# Patient Record
Sex: Male | Born: 1945 | Race: Black or African American | Hispanic: No | Marital: Married | Smoking: Never smoker
Health system: Southern US, Community
[De-identification: ages and names within clinical notes are randomized; demographics above are authoritative.]

## PROBLEM LIST (undated history)

## (undated) DIAGNOSIS — E119 Type 2 diabetes mellitus without complications: Secondary | ICD-10-CM

## (undated) DIAGNOSIS — G8929 Other chronic pain: Secondary | ICD-10-CM

## (undated) DIAGNOSIS — C801 Malignant (primary) neoplasm, unspecified: Secondary | ICD-10-CM

## (undated) DIAGNOSIS — G4730 Sleep apnea, unspecified: Secondary | ICD-10-CM

## (undated) DIAGNOSIS — M1990 Unspecified osteoarthritis, unspecified site: Secondary | ICD-10-CM

## (undated) DIAGNOSIS — F419 Anxiety disorder, unspecified: Secondary | ICD-10-CM

## (undated) DIAGNOSIS — N189 Chronic kidney disease, unspecified: Secondary | ICD-10-CM

## (undated) DIAGNOSIS — I499 Cardiac arrhythmia, unspecified: Secondary | ICD-10-CM

## (undated) DIAGNOSIS — I10 Essential (primary) hypertension: Secondary | ICD-10-CM

## (undated) DIAGNOSIS — F4310 Post-traumatic stress disorder, unspecified: Secondary | ICD-10-CM

## (undated) DIAGNOSIS — F329 Major depressive disorder, single episode, unspecified: Secondary | ICD-10-CM

## (undated) DIAGNOSIS — Z87442 Personal history of urinary calculi: Secondary | ICD-10-CM

## (undated) DIAGNOSIS — F32A Depression, unspecified: Secondary | ICD-10-CM

## (undated) DIAGNOSIS — Q828 Other specified congenital malformations of skin: Secondary | ICD-10-CM

## (undated) HISTORY — PX: OTHER SURGICAL HISTORY: SHX169

## (undated) HISTORY — PX: ORTHOPEDIC SURGERY: SHX850

## (undated) HISTORY — PX: COLONOSCOPY: SHX174

---
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
## 1999-08-15 ENCOUNTER — Encounter (HOSPITAL_BASED_OUTPATIENT_CLINIC_OR_DEPARTMENT_OTHER): Payer: Self-pay | Admitting: General Surgery

## 1999-08-16 ENCOUNTER — Ambulatory Visit (HOSPITAL_COMMUNITY): Admission: RE | Admit: 1999-08-16 | Discharge: 1999-08-17 | Payer: Self-pay | Admitting: General Surgery

## 2001-08-18 ENCOUNTER — Ambulatory Visit (HOSPITAL_BASED_OUTPATIENT_CLINIC_OR_DEPARTMENT_OTHER): Admission: RE | Admit: 2001-08-18 | Discharge: 2001-08-18 | Payer: Self-pay | Admitting: Family Medicine

## 2004-04-23 ENCOUNTER — Encounter: Admission: RE | Admit: 2004-04-23 | Discharge: 2004-04-23 | Payer: Self-pay | Admitting: Family Medicine

## 2004-05-13 ENCOUNTER — Encounter: Admission: RE | Admit: 2004-05-13 | Discharge: 2004-05-13 | Payer: Self-pay | Admitting: Family Medicine

## 2004-11-17 HISTORY — PX: TOTAL HIP ARTHROPLASTY: SHX124

## 2005-01-06 ENCOUNTER — Encounter: Admission: RE | Admit: 2005-01-06 | Discharge: 2005-01-06 | Payer: Self-pay | Admitting: Orthopedic Surgery

## 2005-06-25 ENCOUNTER — Encounter: Admission: RE | Admit: 2005-06-25 | Discharge: 2005-06-25 | Payer: Self-pay | Admitting: Sports Medicine

## 2005-09-03 ENCOUNTER — Inpatient Hospital Stay (HOSPITAL_COMMUNITY): Admission: RE | Admit: 2005-09-03 | Discharge: 2005-09-07 | Payer: Self-pay | Admitting: Orthopedic Surgery

## 2008-02-27 ENCOUNTER — Emergency Department: Payer: Self-pay | Admitting: Internal Medicine

## 2010-04-12 ENCOUNTER — Emergency Department (HOSPITAL_COMMUNITY): Admission: EM | Admit: 2010-04-12 | Discharge: 2010-04-12 | Payer: Self-pay | Admitting: Emergency Medicine

## 2010-04-14 ENCOUNTER — Emergency Department (HOSPITAL_COMMUNITY): Admission: EM | Admit: 2010-04-14 | Discharge: 2010-04-14 | Payer: Self-pay | Admitting: Emergency Medicine

## 2011-02-03 LAB — PROTIME-INR
INR: 1.05 (ref 0.00–1.49)
Prothrombin Time: 13.6 seconds (ref 11.6–15.2)

## 2011-02-03 LAB — DIFFERENTIAL
Basophils Absolute: 0 10*3/uL (ref 0.0–0.1)
Basophils Relative: 0 % (ref 0–1)
Eosinophils Absolute: 0.1 10*3/uL (ref 0.0–0.7)
Eosinophils Relative: 1 % (ref 0–5)
Lymphocytes Relative: 19 % (ref 12–46)
Lymphs Abs: 1.1 10*3/uL (ref 0.7–4.0)
Monocytes Absolute: 0.5 10*3/uL (ref 0.1–1.0)
Monocytes Relative: 9 % (ref 3–12)
Neutro Abs: 3.9 10*3/uL (ref 1.7–7.7)
Neutrophils Relative %: 71 % (ref 43–77)

## 2011-02-03 LAB — CBC
HCT: 41 % (ref 39.0–52.0)
Hemoglobin: 13.8 g/dL (ref 13.0–17.0)
MCHC: 33.7 g/dL (ref 30.0–36.0)
MCV: 85.8 fL (ref 78.0–100.0)
Platelets: 208 10*3/uL (ref 150–400)
RBC: 4.78 MIL/uL (ref 4.22–5.81)
RDW: 16.2 % — ABNORMAL HIGH (ref 11.5–15.5)
WBC: 5.6 10*3/uL (ref 4.0–10.5)

## 2011-02-03 LAB — POCT I-STAT, CHEM 8
BUN: 16 mg/dL (ref 6–23)
Calcium, Ion: 1.14 mmol/L (ref 1.12–1.32)
Chloride: 107 mEq/L (ref 96–112)
Creatinine, Ser: 1.1 mg/dL (ref 0.4–1.5)
Glucose, Bld: 111 mg/dL — ABNORMAL HIGH (ref 70–99)
HCT: 44 % (ref 39.0–52.0)
Hemoglobin: 15 g/dL (ref 13.0–17.0)
Potassium: 3.7 mEq/L (ref 3.5–5.1)
Sodium: 141 mEq/L (ref 135–145)
TCO2: 26 mmol/L (ref 0–100)

## 2011-04-04 NOTE — Op Note (Signed)
NAME:  Javier Watson, Javier Watson NO.:  [id number]   MEDICAL RECORD NO.:  [id number]          PATIENT TYPE:  INP   LOCATION:  2899                         FACILITY:  [facility]   PHYSICIAN:  Elana Alm. Thurston Hole, M.D. DATE OF BIRTH:  12/01/1945   DATE OF PROCEDURE:  09/03/2005  DATE OF DISCHARGE:                                 OPERATIVE REPORT   PREOPERATIVE DIAGNOSIS:  Left hip degenerative joint disease.   POSTOPERATIVE DIAGNOSIS:  Left hip degenerative joint disease.   PROCEDURE:  Left total hip replacement using S-ROM press-fit total hip  prosthesis with femoral component 18 x 30 mm stem with 36 +12 neck with 0/36-  mm femoral head with 18 F XXL sleeve, acetabulum 54-mm press-fit acetabulum  with 1 locking screw and metal liner.   SURGEON:  Elana Alm. Thurston Hole, M.D.   ASSISTANT:  Julien Girt, P.A.   ANESTHESIA:  General.   OPERATIVE TIME:  2 hours   ESTIMATED BLOOD LOSS:  500 mL.   COMPLICATIONS:  None.   DESCRIPTION OF PROCEDURE:  Javier Watson was brought to the operating room on  09/03/2005 and placed on the operating table in the supine position.  After  an adequate level of general anesthesia was obtained his left hip was  examined.  He had flexion to 90, extension to 0, internal and external  rotation of 20 degrees with approximately 0.5 cm shortening on the left leg  compared to the right.  He had a Foley catheter placed under sterile  conditions and received Ancef 1 gm IV preoperatively for prophylaxis.  He  was then turned into the left lateral decubitus position and secured on the  bed with a Mark frame.  The left hip and leg was then prepped using sterile  DuraPrep and draped using sterile technique.   Originally through a 15 cm posterolateral greater trochanteric incision  initial exposure was made.  The underlying subcutaneous tissues were incised  along with sin incision. The iliotibial band and gluteus maximus fascia was  incised longitudinally  revealing the underlying sciatic nerve which was  carefully protected.  The short external rotators of the hip and hip capsule  were carefully released off of their femoral neck insertion then tagged, and  then the hip was posteriorly dislocated.  The femoral head was found to have  grade 3 and 4  DJD with significant deformity of the femoral head.  The  femoral neck cut was then made 2.5 cm above the lesser trochanter.  At this  point, then the acetabulum was carefully exposed; carefully placed  retractors were placed.  Degenerative acetabular labrum was removed.  He was  found to have grade 3 and 4 changes throughout the acetabulum. Sequential  acetabular reaming was then carried out and the appropriate amount of  anteversion, abduction and inclination reaming up to a 53 mm size and then a  54 mm cup trial was placed and this was found to give an excellent fit.  It  was then removed and the actual 54-mm press-fit acetabular shell was placed  and hammered into position with the  appropriate amount of  anteversion,  abduction and inclination. It was very tight with an excellent fit, but it  was also further secured in place with one 20-mm, locking screw in the 12  o'clock position.   The metal liner was then placed inside the shell and hammered into position  with an excellent Morse taper fit. After this was done, then the proximal  femur was then exposed.  Sequential distal reaming was carried out to a 13.5-  mm size and then conical reaming carried out as well to a size F and then  the milling reamer was used to mill to an XXL size.  A trial sleeve XXL was  then placed followed by a trial femoral stem and with a trial femoral neck  of 36 +12 with a 0 head, 36 size, this was then placed on the trial  prosthesis and the hip reduced, found to give excellent reduction, excellent  maintainment of his leg lengths and equalized the leg lengths.  Excellent  stability up to 70-degrees of internal  rotation in both neutral and 30-  degrees of adduction and also stable in abduction and external rotation.  Because of this, it was felt that the trial femoral component was the  perfect size.  The trial was then removed, the femoral canal irrigated, and  then the actual XXL sleeve was hammered into position with an excellent and  then the 18 x 30 mm with the 36 +12 neck was hammered into position as well  with approximately 10-degrees of further anteversion, and then a 0 x 36 mm  femoral head was hammered onto the femoral neck with an excellent fit and  then the hip reduced taking it through full range of motion, found to be  stable, and leg lengths equal.   At this point, it was felt that all of the components were of excellent  size, fit, and stability.  The wound was further irrigated and then the  short external rotator of the hip and the hip capsule were reattached to  their femoral neck insertion through two drill holes on the greater  trochanter.  The iliotibial band and gluteus maximus fascia was closed with  #1 Ethibond sutures.  Subcutaneous tissues were closed with #0 and 2-0  Vicryl.  Skin closed with skin staples.  Sterile dressings were applied.  A  cape abduction pillow applied.  The patient turned supine, checked for leg  lengths that were equal, rotation equal.  Pulses 2+ and symmetric.  He was  then awakened, extubated, and taken to the recovery room in stable  condition.  The needle and sponge counts were correct x2 at the end of the  case.      Robert A. Thurston Hole, M.D.  Electronically Signed     RAW/MEDQ  D:  09/03/2005  T:  09/03/2005  Job:  [id number]

## 2011-04-04 NOTE — Discharge Summary (Signed)
NAME:  Javier Watson, Javier Watson NO.:  [id number]   MEDICAL RECORD NO.:  [id number]          PATIENT TYPE:  INP   LOCATION:  5006                         FACILITY:  [facility]   PHYSICIAN:  Elana Alm. Thurston Hole, M.D. DATE OF BIRTH:  06-07-1946   DATE OF ADMISSION:  09/03/2005  DATE OF DISCHARGE:  09/07/2005                                 DISCHARGE SUMMARY   ADMISSION DIAGNOSES:  1.  End-stage degenerative joint disease left hip.  2.  Hypertension.  3.  Sleep apnea.   DISCHARGE DIAGNOSES:  1.  End-stage degenerative joint disease left hip.  2.  Hypertension.  3.  Sleep apnea.  4.  Hypokalemia.   HISTORY OF PRESENT ILLNESS:  Patient is a 65 year old black male with a  history of hypertension and end-stage DJD of his hip.  He has failed  conservative care including anti-inflammatories and intra-articular  cortisone injections.  He understands the risks, benefits and possible  complications of a left total hip replacement and is without question.   PROCEDURE IN HOUSE:  On September 03, 2005, patient underwent a left total hip  replacement by Dr. Thurston Hole.  He was admitted postoperatively for pain  control, DVT prophylaxis and physical therapy.  Respiratory was consulted to  place him in a CPAP.   Postoperative day #1, patient was doing well and he slept well through the  night in his CPAP.  His hemoglobin was 11.9, INR 1.1.  His renal function  was slightly decreased with a BUN of 11 and creatinine of 1.5.  He was given  a normal saline bolus due to his dehydration.  His PCA was discontinued and  he was started on Percocet for pain.   Postoperative day #2, patient doing well except fever of 103.5, INR was 1.5.  Hemoglobin was 11.0.  Renal function was improved with BUN of 10 and  creatinine of 1.3.  Potassium supplementation was ordered for his potassium  of 3.3.  Blood cultures, chest x-ray and UA were ordered if he spiked a  temperature again.   Postoperative day #3, patient  has been afebrile.  Blood pressure 140/75.  Temperature 98.7.  His hemoglobin is 10.6.  INR is 1.6.  His potassium is 3.  His BUN is 13, creatinine is 1.4.  Chest x-ray showed no acute bony  abnormalities.  Blood culture showed no growth.   Postoperative day #4, patient continued to be alert, awake and comfortable.  Still had no growth.   He was discharged home on Coumadin, Percocet, potassium, Norvasc 5 mg daily  and Benicar 20 mg daily.   He achieves a CPM 0 to 90 degrees.  He has been instructed to be touchdown  weightbearing and have daily dressing changes, call with increased pain,  increased redness, increased drainage or a temperature greater than 101.  He  will follow up in the office on September 16, 2005, at 11 a.m.      Julien Girt, P.A.      Robert A. Thurston Hole, M.D.  Electronically Signed    KS/MEDQ  D:  10/27/2005  T:  10/28/2005  Job:  [id number]

## 2011-07-19 ENCOUNTER — Emergency Department (HOSPITAL_COMMUNITY): Payer: No Typology Code available for payment source

## 2011-07-19 ENCOUNTER — Emergency Department (HOSPITAL_COMMUNITY)
Admission: EM | Admit: 2011-07-19 | Discharge: 2011-07-19 | Disposition: A | Discharge status: Home / Self Care | Payer: No Typology Code available for payment source | Attending: Emergency Medicine | Admitting: Emergency Medicine

## 2011-07-19 DIAGNOSIS — S61209A Unspecified open wound of unspecified finger without damage to nail, initial encounter: Secondary | ICD-10-CM | POA: Insufficient documentation

## 2011-07-19 DIAGNOSIS — W208XXA Other cause of strike by thrown, projected or falling object, initial encounter: Secondary | ICD-10-CM | POA: Insufficient documentation

## 2011-07-19 DIAGNOSIS — Y92009 Unspecified place in unspecified non-institutional (private) residence as the place of occurrence of the external cause: Secondary | ICD-10-CM | POA: Insufficient documentation

## 2011-07-19 DIAGNOSIS — I10 Essential (primary) hypertension: Secondary | ICD-10-CM | POA: Insufficient documentation

## 2011-07-19 DIAGNOSIS — M79609 Pain in unspecified limb: Secondary | ICD-10-CM | POA: Insufficient documentation

## 2013-10-12 ENCOUNTER — Emergency Department: Payer: Self-pay | Admitting: Emergency Medicine

## 2016-01-16 HISTORY — PX: CYSTOSCOPY WITH URETEROSCOPY AND STENT PLACEMENT: SHX6377

## 2016-04-17 HISTORY — PX: URETEROSCOPY: SHX842

## 2016-04-22 ENCOUNTER — Ambulatory Visit: Payer: 59 | Admitting: Family Medicine

## 2016-04-22 DIAGNOSIS — Z0289 Encounter for other administrative examinations: Secondary | ICD-10-CM

## 2016-05-14 DIAGNOSIS — E876 Hypokalemia: Secondary | ICD-10-CM | POA: Diagnosis present

## 2016-05-14 DIAGNOSIS — I471 Supraventricular tachycardia: Principal | ICD-10-CM | POA: Diagnosis present

## 2016-05-14 DIAGNOSIS — R7303 Prediabetes: Secondary | ICD-10-CM | POA: Diagnosis present

## 2016-05-14 DIAGNOSIS — R338 Other retention of urine: Secondary | ICD-10-CM | POA: Diagnosis present

## 2016-05-14 DIAGNOSIS — Z7952 Long term (current) use of systemic steroids: Secondary | ICD-10-CM

## 2016-05-14 DIAGNOSIS — K219 Gastro-esophageal reflux disease without esophagitis: Secondary | ICD-10-CM | POA: Diagnosis present

## 2016-05-14 DIAGNOSIS — G8929 Other chronic pain: Secondary | ICD-10-CM | POA: Diagnosis present

## 2016-05-14 DIAGNOSIS — Z8249 Family history of ischemic heart disease and other diseases of the circulatory system: Secondary | ICD-10-CM

## 2016-05-14 DIAGNOSIS — R079 Chest pain, unspecified: Secondary | ICD-10-CM | POA: Diagnosis not present

## 2016-05-14 DIAGNOSIS — G4730 Sleep apnea, unspecified: Secondary | ICD-10-CM | POA: Diagnosis present

## 2016-05-14 DIAGNOSIS — F329 Major depressive disorder, single episode, unspecified: Secondary | ICD-10-CM | POA: Diagnosis present

## 2016-05-14 DIAGNOSIS — Z79899 Other long term (current) drug therapy: Secondary | ICD-10-CM

## 2016-05-14 DIAGNOSIS — I10 Essential (primary) hypertension: Secondary | ICD-10-CM | POA: Diagnosis present

## 2016-05-14 DIAGNOSIS — G4733 Obstructive sleep apnea (adult) (pediatric): Secondary | ICD-10-CM | POA: Diagnosis present

## 2016-05-14 DIAGNOSIS — N179 Acute kidney failure, unspecified: Secondary | ICD-10-CM | POA: Diagnosis present

## 2016-05-14 DIAGNOSIS — N401 Enlarged prostate with lower urinary tract symptoms: Secondary | ICD-10-CM | POA: Diagnosis present

## 2016-05-14 NOTE — ED Notes (Addendum)
Pt to triage via w/c with no distress noted; reports sudden onset sensation of heart racing; denies pain or any accomp symptoms; st d/c from [place] approx 2wks ago for same and was dx with "irregular heart beat, was given 2 injections to stop it and prescribed oral medication"; pt taken immediately to room 18 and placed on card monitor; Dr Owens Shark notified and called to room

## 2016-05-15 ENCOUNTER — Inpatient Hospital Stay (HOSPITAL_COMMUNITY)
Admit: 2016-05-15 | Discharge: 2016-05-15 | Disposition: A | Discharge status: Home / Self Care | Payer: Medicare Other | Attending: Internal Medicine | Admitting: Internal Medicine

## 2016-05-15 ENCOUNTER — Inpatient Hospital Stay
Admission: EM | Admit: 2016-05-15 | Discharge: 2016-05-16 | DRG: 309 | Disposition: A | Discharge status: Home / Self Care | Payer: Medicare Other | Attending: Internal Medicine | Admitting: Internal Medicine

## 2016-05-15 ENCOUNTER — Encounter: Payer: Self-pay | Admitting: Internal Medicine

## 2016-05-15 DIAGNOSIS — G8929 Other chronic pain: Secondary | ICD-10-CM | POA: Diagnosis present

## 2016-05-15 DIAGNOSIS — G4733 Obstructive sleep apnea (adult) (pediatric): Secondary | ICD-10-CM | POA: Diagnosis present

## 2016-05-15 DIAGNOSIS — R7989 Other specified abnormal findings of blood chemistry: Secondary | ICD-10-CM

## 2016-05-15 DIAGNOSIS — I10 Essential (primary) hypertension: Secondary | ICD-10-CM | POA: Diagnosis present

## 2016-05-15 DIAGNOSIS — R079 Chest pain, unspecified: Secondary | ICD-10-CM | POA: Diagnosis present

## 2016-05-15 DIAGNOSIS — I471 Supraventricular tachycardia, unspecified: Secondary | ICD-10-CM

## 2016-05-15 DIAGNOSIS — R7303 Prediabetes: Secondary | ICD-10-CM

## 2016-05-15 DIAGNOSIS — I214 Non-ST elevation (NSTEMI) myocardial infarction: Secondary | ICD-10-CM | POA: Insufficient documentation

## 2016-05-15 DIAGNOSIS — E876 Hypokalemia: Secondary | ICD-10-CM

## 2016-05-15 DIAGNOSIS — Z8249 Family history of ischemic heart disease and other diseases of the circulatory system: Secondary | ICD-10-CM | POA: Diagnosis not present

## 2016-05-15 DIAGNOSIS — R338 Other retention of urine: Secondary | ICD-10-CM | POA: Diagnosis present

## 2016-05-15 DIAGNOSIS — F329 Major depressive disorder, single episode, unspecified: Secondary | ICD-10-CM | POA: Diagnosis present

## 2016-05-15 DIAGNOSIS — K219 Gastro-esophageal reflux disease without esophagitis: Secondary | ICD-10-CM | POA: Diagnosis present

## 2016-05-15 DIAGNOSIS — N179 Acute kidney failure, unspecified: Secondary | ICD-10-CM

## 2016-05-15 DIAGNOSIS — G4730 Sleep apnea, unspecified: Secondary | ICD-10-CM | POA: Diagnosis present

## 2016-05-15 DIAGNOSIS — N401 Enlarged prostate with lower urinary tract symptoms: Secondary | ICD-10-CM | POA: Diagnosis present

## 2016-05-15 DIAGNOSIS — Z79899 Other long term (current) drug therapy: Secondary | ICD-10-CM | POA: Diagnosis not present

## 2016-05-15 DIAGNOSIS — Z7952 Long term (current) use of systemic steroids: Secondary | ICD-10-CM | POA: Diagnosis not present

## 2016-05-15 DIAGNOSIS — N400 Enlarged prostate without lower urinary tract symptoms: Secondary | ICD-10-CM

## 2016-05-15 DIAGNOSIS — N189 Chronic kidney disease, unspecified: Secondary | ICD-10-CM

## 2016-05-15 DIAGNOSIS — R778 Other specified abnormalities of plasma proteins: Secondary | ICD-10-CM

## 2016-05-15 HISTORY — DX: Other chronic pain: G89.29

## 2016-05-15 HISTORY — DX: Essential (primary) hypertension: I10

## 2016-05-15 LAB — CBC WITH DIFFERENTIAL/PLATELET
Basophils Absolute: 0 10*3/uL (ref 0–0.1)
Basophils Relative: 1 %
Eosinophils Absolute: 0.1 10*3/uL (ref 0–0.7)
Eosinophils Relative: 2 %
HCT: 32.3 % — ABNORMAL LOW (ref 40.0–52.0)
Hemoglobin: 11.3 g/dL — ABNORMAL LOW (ref 13.0–18.0)
Lymphocytes Relative: 34 %
Lymphs Abs: 2.1 10*3/uL (ref 1.0–3.6)
MCH: 29.1 pg (ref 26.0–34.0)
MCHC: 35.1 g/dL (ref 32.0–36.0)
MCV: 82.9 fL (ref 80.0–100.0)
Monocytes Absolute: 0.7 10*3/uL (ref 0.2–1.0)
Monocytes Relative: 12 %
Neutro Abs: 3.1 10*3/uL (ref 1.4–6.5)
Neutrophils Relative %: 51 %
Platelets: 153 10*3/uL (ref 150–440)
RBC: 3.9 MIL/uL — ABNORMAL LOW (ref 4.40–5.90)
RDW: 16.9 % — ABNORMAL HIGH (ref 11.5–14.5)
WBC: 6 10*3/uL (ref 3.8–10.6)

## 2016-05-15 LAB — COMPREHENSIVE METABOLIC PANEL
ALT: 31 U/L (ref 17–63)
AST: 31 U/L (ref 15–41)
Albumin: 3.1 g/dL — ABNORMAL LOW (ref 3.5–5.0)
Alkaline Phosphatase: 102 U/L (ref 38–126)
Anion gap: 9 (ref 5–15)
BUN: 25 mg/dL — ABNORMAL HIGH (ref 6–20)
CO2: 24 mmol/L (ref 22–32)
Calcium: 8.7 mg/dL — ABNORMAL LOW (ref 8.9–10.3)
Chloride: 105 mmol/L (ref 101–111)
Creatinine, Ser: 1.89 mg/dL — ABNORMAL HIGH (ref 0.61–1.24)
GFR calc Af Amer: 40 mL/min — ABNORMAL LOW (ref >60)
GFR calc non Af Amer: 35 mL/min — ABNORMAL LOW (ref >60)
Glucose, Bld: 161 mg/dL — ABNORMAL HIGH (ref 65–99)
Potassium: 2.8 mmol/L — ABNORMAL LOW (ref 3.5–5.1)
Sodium: 138 mmol/L (ref 135–145)
Total Bilirubin: <0.1 mg/dL — ABNORMAL LOW (ref 0.3–1.2)
Total Protein: 6.4 g/dL — ABNORMAL LOW (ref 6.5–8.1)

## 2016-05-15 LAB — MAGNESIUM: Magnesium: 1.6 mg/dL — ABNORMAL LOW (ref 1.7–2.4)

## 2016-05-15 LAB — TROPONIN I
Troponin I: 0.27 ng/mL (ref <0.03)
Troponin I: 0.31 ng/mL (ref <0.03)
Troponin I: 0.24 ng/mL (ref <0.03)

## 2016-05-15 LAB — ECHOCARDIOGRAM COMPLETE
Height: 68 in
Weight: 3680 oz

## 2016-05-15 LAB — HEMOGLOBIN A1C: Hgb A1c MFr Bld: 6.2 % — ABNORMAL HIGH (ref 4.0–6.0)

## 2016-05-15 LAB — PHOSPHORUS: Phosphorus: 3.4 mg/dL (ref 2.5–4.6)

## 2016-05-15 LAB — POTASSIUM
Potassium: 3.2 mmol/L — ABNORMAL LOW (ref 3.5–5.1)
Potassium: 3.9 mmol/L (ref 3.5–5.1)

## 2016-05-15 LAB — TSH
TSH: 1.141 u[IU]/mL (ref 0.350–4.500)
TSH: 1.063 u[IU]/mL (ref 0.350–4.500)

## 2016-05-15 MED ORDER — ADENOSINE 6 MG/2ML IV SOLN
INTRAVENOUS | Status: AC
  Administered 2016-05-15: 6 mg
  Filled 2016-05-15: qty 2

## 2016-05-15 MED ORDER — LOSARTAN POTASSIUM 50 MG PO TABS
100.0000 mg | ORAL_TABLET | Freq: Every day | ORAL | Status: DC
  Administered 2016-05-16: 100 mg via ORAL
  Filled 2016-05-15 (×2): qty 2
  Filled 2016-05-15: qty 4

## 2016-05-15 MED ORDER — POTASSIUM CHLORIDE CRYS ER 20 MEQ PO TBCR
EXTENDED_RELEASE_TABLET | ORAL | Status: AC
  Administered 2016-05-15: 40 meq via ORAL
  Filled 2016-05-15: qty 2

## 2016-05-15 MED ORDER — POTASSIUM CHLORIDE IN NACL 40-0.9 MEQ/L-% IV SOLN
INTRAVENOUS | Status: DC
  Administered 2016-05-15 (×3): 125 mL/h via INTRAVENOUS
  Filled 2016-05-15 (×6): qty 1000

## 2016-05-15 MED ORDER — CYCLOBENZAPRINE HCL 10 MG PO TABS: 5.0000 mg | ORAL_TABLET | Freq: Three times a day (TID) | ORAL | Status: DC | PRN

## 2016-05-15 MED ORDER — ACETAMINOPHEN 325 MG PO TABS: 650.0000 mg | ORAL_TABLET | Freq: Four times a day (QID) | ORAL | Status: DC | PRN

## 2016-05-15 MED ORDER — HEPARIN SODIUM (PORCINE) 5000 UNIT/ML IJ SOLN
5000.0000 [IU] | Freq: Three times a day (TID) | INTRAMUSCULAR | Status: DC
  Administered 2016-05-15 – 2016-05-16 (×4): 5000 [IU] via SUBCUTANEOUS
  Filled 2016-05-15 (×4): qty 1

## 2016-05-15 MED ORDER — SODIUM CHLORIDE 0.9% FLUSH: 3.0000 mL | Freq: Two times a day (BID) | INTRAVENOUS | Status: DC

## 2016-05-15 MED ORDER — MORPHINE SULFATE (PF) 2 MG/ML IV SOLN: 2.0000 mg | INTRAVENOUS | Status: DC | PRN

## 2016-05-15 MED ORDER — DILTIAZEM HCL 25 MG/5ML IV SOLN
10.0000 mg | Freq: Once | INTRAVENOUS | Status: AC
  Administered 2016-05-15: 10 mg via INTRAVENOUS

## 2016-05-15 MED ORDER — ONDANSETRON HCL 4 MG/2ML IJ SOLN: 4.0000 mg | Freq: Four times a day (QID) | INTRAMUSCULAR | Status: DC | PRN

## 2016-05-15 MED ORDER — POTASSIUM CHLORIDE IN NACL 40-0.9 MEQ/L-% IV SOLN
INTRAVENOUS | Status: AC
  Filled 2016-05-15: qty 1000

## 2016-05-15 MED ORDER — DILTIAZEM HCL 60 MG PO TABS
60.0000 mg | ORAL_TABLET | Freq: Three times a day (TID) | ORAL | Status: DC
  Administered 2016-05-15: 60 mg via ORAL
  Filled 2016-05-15: qty 1

## 2016-05-15 MED ORDER — MAGNESIUM SULFATE 2 GM/50ML IV SOLN
2.0000 g | Freq: Once | INTRAVENOUS | Status: AC
  Administered 2016-05-15: 2 g via INTRAVENOUS
  Filled 2016-05-15: qty 50

## 2016-05-15 MED ORDER — ACETAMINOPHEN 650 MG RE SUPP: 650.0000 mg | Freq: Four times a day (QID) | RECTAL | Status: DC | PRN

## 2016-05-15 MED ORDER — POTASSIUM CHLORIDE CRYS ER 20 MEQ PO TBCR
40.0000 meq | EXTENDED_RELEASE_TABLET | ORAL | Status: AC
  Administered 2016-05-15 (×2): 40 meq via ORAL
  Filled 2016-05-15: qty 2

## 2016-05-15 MED ORDER — MORPHINE SULFATE 15 MG PO TABS: 15.0000 mg | ORAL_TABLET | ORAL | Status: DC | PRN

## 2016-05-15 MED ORDER — OXYBUTYNIN CHLORIDE 5 MG PO TABS
5.0000 mg | ORAL_TABLET | Freq: Three times a day (TID) | ORAL | Status: DC
  Administered 2016-05-15 – 2016-05-16 (×3): 5 mg via ORAL
  Filled 2016-05-15 (×8): qty 1

## 2016-05-15 MED ORDER — CITALOPRAM HYDROBROMIDE 20 MG PO TABS
40.0000 mg | ORAL_TABLET | Freq: Every day | ORAL | Status: DC
  Administered 2016-05-15 – 2016-05-16 (×2): 40 mg via ORAL
  Filled 2016-05-15 (×2): qty 2

## 2016-05-15 MED ORDER — PANTOPRAZOLE SODIUM 40 MG PO TBEC
40.0000 mg | DELAYED_RELEASE_TABLET | Freq: Two times a day (BID) | ORAL | Status: DC
  Administered 2016-05-15 – 2016-05-16 (×3): 40 mg via ORAL
  Filled 2016-05-15 (×3): qty 1

## 2016-05-15 MED ORDER — AMLODIPINE BESYLATE 5 MG PO TABS: 5.0000 mg | ORAL_TABLET | Freq: Every day | ORAL | Status: DC

## 2016-05-15 MED ORDER — DOCUSATE SODIUM 100 MG PO CAPS
100.0000 mg | ORAL_CAPSULE | Freq: Two times a day (BID) | ORAL | Status: DC
  Administered 2016-05-15 – 2016-05-16 (×3): 100 mg via ORAL
  Filled 2016-05-15 (×3): qty 1

## 2016-05-15 MED ORDER — METOPROLOL TARTRATE 25 MG PO TABS
25.0000 mg | ORAL_TABLET | Freq: Every day | ORAL | Status: DC
  Administered 2016-05-15: 25 mg via ORAL
  Filled 2016-05-15: qty 1

## 2016-05-15 MED ORDER — PRAZOSIN HCL 5 MG PO CAPS
5.0000 mg | ORAL_CAPSULE | Freq: Two times a day (BID) | ORAL | Status: DC
  Administered 2016-05-15 – 2016-05-16 (×3): 5 mg via ORAL
  Filled 2016-05-15 (×4): qty 1

## 2016-05-15 MED ORDER — DILTIAZEM HCL 100 MG IV SOLR
5.0000 mg/h | Freq: Once | INTRAVENOUS | Status: DC
  Filled 2016-05-15: qty 100

## 2016-05-15 MED ORDER — POTASSIUM CHLORIDE CRYS ER 20 MEQ PO TBCR
20.0000 meq | EXTENDED_RELEASE_TABLET | Freq: Every day | ORAL | Status: DC
  Administered 2016-05-15 – 2016-05-16 (×2): 20 meq via ORAL
  Filled 2016-05-15 (×2): qty 1

## 2016-05-15 MED ORDER — SODIUM CHLORIDE 0.9 % IV BOLUS (SEPSIS)
1000.0000 mL | Freq: Once | INTRAVENOUS | Status: AC
  Administered 2016-05-15: 1000 mL via INTRAVENOUS

## 2016-05-15 MED ORDER — DILTIAZEM HCL 25 MG/5ML IV SOLN
INTRAVENOUS | Status: AC
  Administered 2016-05-15: 10 mg via INTRAVENOUS
  Filled 2016-05-15: qty 5

## 2016-05-15 MED ORDER — HYDROCODONE-ACETAMINOPHEN 5-325 MG PO TABS: 1.0000 | ORAL_TABLET | Freq: Four times a day (QID) | ORAL | Status: DC | PRN

## 2016-05-15 MED ORDER — ADENOSINE 12 MG/4ML IV SOLN
INTRAVENOUS | Status: AC
  Administered 2016-05-15: 12 mg
  Filled 2016-05-15: qty 4

## 2016-05-15 MED ORDER — BUPROPION HCL ER (SR) 100 MG PO TB12
200.0000 mg | ORAL_TABLET | Freq: Two times a day (BID) | ORAL | Status: DC
  Administered 2016-05-15 – 2016-05-16 (×3): 200 mg via ORAL
  Filled 2016-05-15 (×4): qty 2

## 2016-05-15 MED ORDER — DILTIAZEM HCL ER COATED BEADS 120 MG PO CP24
120.0000 mg | ORAL_CAPSULE | Freq: Two times a day (BID) | ORAL | Status: DC
  Administered 2016-05-15 – 2016-05-16 (×2): 120 mg via ORAL
  Filled 2016-05-15 (×2): qty 1

## 2016-05-15 MED ORDER — ONDANSETRON HCL 4 MG PO TABS: 4.0000 mg | ORAL_TABLET | Freq: Four times a day (QID) | ORAL | Status: DC | PRN

## 2016-05-15 NOTE — Progress Notes (Signed)
MEDICATION RELATED CONSULT NOTE - INITIAL   Pharmacy Consult for Electrolyte Management Indication: Hypokalemia  No Known Allergies  Patient Measurements: Height: 5\' 8"  (172.7 cm) Weight: 230 lb (104.327 kg) IBW/kg (Calculated) : 68.4 Adjusted Body Weight: 82.7 kg  Vital Signs: Temp: 98.1 F (36.7 C) (06/29 1044) Temp Source: Oral (06/29 1044) BP: 121/59 mmHg (06/29 1301) Pulse Rate: 50 (06/29 1301) Intake/Output from previous day: 06/28 0701 - 06/29 0700 In: 141.7 [P.O.:100; I.V.:41.7] Out: -  Intake/Output from this shift:    Labs:  Recent Labs  05/15/16 0030 05/15/16 0851  WBC 6.0  --   HGB 11.3*  --   HCT 32.3*  --   PLT 153  --   CREATININE 1.89*  --   MG  --  1.6*  PHOS  --  3.4  ALBUMIN 3.1*  --   PROT 6.4*  --   AST 31  --   ALT 31  --   ALKPHOS 102  --   BILITOT <0.1*  --    Estimated Creatinine Clearance: 43.2 mL/min (by C-G formula based on Cr of 1.89).   Microbiology: No results found for this or any previous visit (from the past 720 hour(s)).  Medical History: Past Medical History  Diagnosis Date  . Hypertension   . 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 buPROPion  200 mg Oral BID  . citalopram  40 mg Oral Daily  . diltiazem  120 mg Oral BID  . docusate sodium  100 mg Oral BID  . heparin  5,000 Units Subcutaneous Q8H  . losartan  100 mg Oral Daily  . oxybutynin  5 mg Oral TID PC & HS  . pantoprazole  40 mg Oral BID  . potassium chloride SA  20 mEq Oral Daily  . prazosin  5 mg Oral BID  . sodium chloride flush  3 mL Intravenous Q12H   Infusions:  . 0.9 % NaCl with KCl 40 mEq / L 125 mL/hr (05/15/16 1351)    Assessment: Patient admitted for NSTEMI. Hypokalemic on admission with K of 2.8, received KCl 73mEq PO x 2 and initiated on IV with KCl. Repeat K this morning of 3.2, Mag=1.6, Phos=3.4. Currently ordered KCl 76mEq PO daily in addition to IV fluid supplementation. Has order for Mag Sulfate 2g IV  once.  Goal of Therapy:  Keep electrolyes WNL  Plan:  K=3.9. No supplementation needed. Follow up labs in the AM  Lynsay Fesperman D Nakayla Rorabaugh, Pharm.D Clinical Pharmacist   05/15/2016 7:04 PM

## 2016-05-15 NOTE — Progress Notes (Signed)
MEDICATION RELATED CONSULT NOTE - INITIAL   Pharmacy Consult for Electrolyte Supplementation  Indication: hypokalemia  No Known Allergies  Patient Measurements: Height: 5\' 8"  (172.7 cm) Weight: 230 lb (104.327 kg) IBW/kg (Calculated) : 68.4   Vital Signs: Temp: 98.1 F (36.7 C) (06/29 1044) Temp Source: Oral (06/29 1044) BP: 158/83 mmHg (06/29 1044) Pulse Rate: 60 (06/29 1044) Intake/Output from previous day: 06/28 0701 - 06/29 0700 In: 141.7 [P.O.:100; I.V.:41.7] Out: -  Intake/Output from this shift: Total I/O In: 601.8 [P.O.:240; I.V.:361.8] Out: 600 [Urine:600]  Labs:  Recent Labs  05/15/16 0030 05/15/16 0851  WBC 6.0  --   HGB 11.3*  --   HCT 32.3*  --   PLT 153  --   CREATININE 1.89*  --   MG  --  1.6*  PHOS  --  3.4  ALBUMIN 3.1*  --   PROT 6.4*  --   AST 31  --   ALT 31  --   ALKPHOS 102  --   BILITOT <0.1*  --    Estimated Creatinine Clearance: 43.2 mL/min (by C-G formula based on Cr of 1.89).   Microbiology: No results found for this or any previous visit (from the past 720 hour(s)).  Medical History: Past Medical History  Diagnosis Date  . Hypertension   . 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 buPROPion  200 mg Oral BID  . citalopram  40 mg Oral Daily  . diltiazem  60 mg Oral Q8H  . docusate sodium  100 mg Oral BID  . heparin  5,000 Units Subcutaneous Q8H  . losartan  100 mg Oral Daily  . magnesium sulfate 1 - 4 g bolus IVPB  2 g Intravenous Once  . metoprolol tartrate  25 mg Oral Daily  . oxybutynin  5 mg Oral TID PC & HS  . pantoprazole  40 mg Oral BID  . potassium chloride SA  20 mEq Oral Daily  . prazosin  5 mg Oral BID  . sodium chloride flush  3 mL Intravenous Q12H   Infusions:  . 0.9 % NaCl with KCl 40 mEq / L 125 mL/hr at 05/15/16 1100    Assessment: Pharmacy consulted to supplement electrolytes in a 70 yo male admitted for NSTEMI. Patient has received KCl 40 meq x 2 doses and fluids  with 40 meq of KCl at 125 mL/hr.  Currently has scheduled KCl 20 meq po daily.   K: 2.9, Mag; 1.6, Phos: 3.4 F/U K: 3.2  Goal of Therapy:  K: 3.5-5.1 Mag: 1.7-2.4 Phos: 2.5-4.6  Plan:  Follow up potassium at 0851 of 3.2.  Patient received KCl 20 meq po and has KCl in fluids to provide ~120 meq/24h period. Will recheck potassium level at 1800 to ensure no further supplementation warranted.  BMP/mag ordered with AM labs.   Pharmacy will continue to follow.   Md Smola G 05/15/2016,11:26 AM

## 2016-05-15 NOTE — Progress Notes (Signed)
*  PRELIMINARY RESULTS* Echocardiogram 2D Echocardiogram has been performed.  Javier Watson 05/15/2016, 10:46 AM

## 2016-05-15 NOTE — Progress Notes (Signed)
Javier Watson at [place] NAME: Javier Watson    MR#:  [id number]  DATE OF BIRTH:  Nov 12, 1946  SUBJECTIVE:   Patient Had episode of SVT this morning. He tried vagal maneuvers which helped. He was diagnosed over a month ago with SVT at the [place]. He was given metoprolol to take which she says he takes twice a day.  REVIEW OF SYSTEMS:    Review of Systems  Constitutional: Negative for fever, chills and malaise/fatigue.  HENT: Negative for ear discharge, ear pain, hearing loss, nosebleeds and sore throat.   Eyes: Negative for blurred vision and pain.  Respiratory: Negative for cough, hemoptysis, shortness of breath and wheezing.   Cardiovascular: Positive for palpitations. Negative for chest pain and leg swelling.  Gastrointestinal: Negative for nausea, vomiting, abdominal pain, diarrhea and blood in stool.  Genitourinary: Negative for dysuria.  Musculoskeletal: Negative for back pain.  Neurological: Negative for dizziness, tremors, speech change, focal weakness, seizures and headaches.  Endo/Heme/Allergies: Does not bruise/bleed easily.  Psychiatric/Behavioral: Negative for depression, suicidal ideas and hallucinations.    Tolerating Diet: yes     DRUG ALLERGIES:  No Known Allergies  VITALS:  Blood pressure 158/83, pulse 60, temperature 98.1 F (36.7 C), temperature source Oral, resp. rate 18, height 5\' 8"  (1.727 m), weight 104.327 kg (230 lb), SpO2 98 %.  PHYSICAL EXAMINATION:   Physical Exam  Constitutional: He is oriented to person, place, and time and well-developed, well-nourished, and in no distress. No distress.  HENT:  Head: Normocephalic.  Eyes: No scleral icterus.  Neck: Normal range of motion. Neck supple. No JVD present. No tracheal deviation present.  Cardiovascular: Normal rate, regular rhythm and normal heart sounds.  Exam reveals no gallop and no friction rub.   No murmur heard. Pulmonary/Chest: Effort normal and breath  sounds normal. No respiratory distress. He has no wheezes. He has no rales. He exhibits no tenderness.  Abdominal: Soft. Bowel sounds are normal. He exhibits no distension and no mass. There is no tenderness. There is no rebound and no guarding.  Musculoskeletal: Normal range of motion. He exhibits no edema.  Neurological: He is alert and oriented to person, place, and time.  Skin: Skin is warm. No rash noted. No erythema.  Psychiatric: Affect and judgment normal.      LABORATORY PANEL:   CBC  Recent Labs Lab 05/15/16 0030  WBC 6.0  HGB 11.3*  HCT 32.3*  PLT 153   ------------------------------------------------------------------------------------------------------------------  Chemistries   Recent Labs Lab 05/15/16 0030 05/15/16 0851  NA 138  --   K 2.8* 3.2*  CL 105  --   CO2 24  --   GLUCOSE 161*  --   BUN 25*  --   CREATININE 1.89*  --   CALCIUM 8.7*  --   MG  --  1.6*  AST 31  --   ALT 31  --   ALKPHOS 102  --   BILITOT <0.1*  --    ------------------------------------------------------------------------------------------------------------------  Cardiac Enzymes  Recent Labs Lab 05/15/16 0030 05/15/16 0339 05/15/16 0851  TROPONINI 0.24* 0.27* 0.31*   ------------------------------------------------------------------------------------------------------------------  RADIOLOGY:  No results found.   ASSESSMENT AND PLAN:   70 year old male who was admitted for SVT.  1. SVT in the setting of hypo-kalemia and hypo-magnesium: Order echocardiogram. Await cardiology consult. Electrolytes corrected. Continue metoprolol and diltiazem. Check TSH.   2. Elevated troponin: This is likely due to SVT and not ACS. Follow-up on card allergy consult.  3. Essential hypertension: Continue valsartan and the Toprol. Amlodipine was discontinued and diltiazem was added due to problem #1.   4. BPH: Continue Minipress and Ditropan.  5. GERD: Continue PPI.  6.  Electrolyte abnormalities including potassium and magnesium: Repleted and recheck in a.m.  Management plans discussed with the patient and he is in agreement.  CODE STATUS: full  TOTAL TIME TAKING CARE OF THIS PATIENT: 30 minutes.     POSSIBLE D/C tomorrow, DEPENDING ON CLINICAL CONDITION.   Stillman Buenger M.D on 05/15/2016 at 11:56 AM  Between 7am to 6pm - Pager - [phone] After 6pm go to [email or web address] - password [email or web address] [facility]  Office  [phone]  CC: Primary care physician; PROVIDER NOT IN SYSTEM  Note: This dictation was prepared with Dragon dictation along with smaller phrase technology. Any transcriptional errors that result from this process are unintentional.

## 2016-05-15 NOTE — Progress Notes (Signed)
MEDICATION RELATED CONSULT NOTE - INITIAL   Pharmacy Consult for Electrolyte Management Indication: Hypokalemia  No Known Allergies  Patient Measurements: Height: 5\' 8"  (172.7 cm) Weight: 230 lb (104.327 kg) IBW/kg (Calculated) : 68.4 Adjusted Body Weight: 82.7 kg  Vital Signs: Temp: 98.1 F (36.7 C) (06/29 1044) Temp Source: Oral (06/29 1044) BP: 158/83 mmHg (06/29 1044) Pulse Rate: 60 (06/29 1044) Intake/Output from previous day: 06/28 0701 - 06/29 0700 In: 141.7 [P.O.:100; I.V.:41.7] Out: -  Intake/Output from this shift: Total I/O In: 601.8 [P.O.:240; I.V.:361.8] Out: 600 [Urine:600]  Labs:  Recent Labs  05/15/16 0030 05/15/16 0851  WBC 6.0  --   HGB 11.3*  --   HCT 32.3*  --   PLT 153  --   CREATININE 1.89*  --   MG  --  1.6*  PHOS  --  3.4  ALBUMIN 3.1*  --   PROT 6.4*  --   AST 31  --   ALT 31  --   ALKPHOS 102  --   BILITOT <0.1*  --    Estimated Creatinine Clearance: 43.2 mL/min (by C-G formula based on Cr of 1.89).   Microbiology: No results found for this or any previous visit (from the past 720 hour(s)).  Medical History: Past Medical History  Diagnosis Date  . Hypertension   . 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 buPROPion  200 mg Oral BID  . citalopram  40 mg Oral Daily  . diltiazem  60 mg Oral Q8H  . docusate sodium  100 mg Oral BID  . heparin  5,000 Units Subcutaneous Q8H  . losartan  100 mg Oral Daily  . magnesium sulfate 1 - 4 g bolus IVPB  2 g Intravenous Once  . metoprolol tartrate  25 mg Oral Daily  . oxybutynin  5 mg Oral TID PC & HS  . pantoprazole  40 mg Oral BID  . potassium chloride SA  20 mEq Oral Daily  . prazosin  5 mg Oral BID  . sodium chloride flush  3 mL Intravenous Q12H   Infusions:  . 0.9 % NaCl with KCl 40 mEq / L 125 mL/hr at 05/15/16 1100    Assessment: Patient admitted for NSTEMI. Hypokalemic on admission with K of 2.8, received KCl 66mEq PO x 2 and initiated on IV  with KCl. Repeat K this morning of 3.2, Mag=1.6, Phos=3.4. Currently ordered KCl 7mEq PO daily in addition to IV fluid supplementation. Has order for Mag Sulfate 2g IV once.  Goal of Therapy:  Keep electrolyes WNL  Plan:  Will follow up on AM labs.  Paulina Fusi, PharmD, BCPS 05/15/2016 11:39 AM

## 2016-05-15 NOTE — Care Management (Signed)
patient known to the [facility] admitted with SVT which required IV meds to resolve.  Rates up to 150.  Notified [facility] of admission and will speak with patient to determine if a transfer was discussed and or if he has signed refusal to transfer form.  Highest troponin is .[place]

## 2016-05-15 NOTE — Consult Note (Signed)
ELECTROPHYSIOLOGY CONSULT NOTE  Patient ID: Javier Watson, MRN: [id number], DOB/AGE: 69-05-1946 69 y.o. Admit date: 05/15/2016 Date of Consult: 05/15/2016  Primary Physician: [facility] NOT [facility]  Chief Complaint: SVT   HPI Javier Watson is a 70 y.o. male  Seen because of SVT. He was admitted last night because it was his second night with recurrent SVT. He been able to stop the episode? Diltiazem.  at home the previous night with a when necessary medicationThis has been associated with fatigue and tachycardia palpitations which are frog negative. He had ST depression His troponins are borderline positive.  His history goes back just a number of months. In his mind healing 52 hospitalization associated with ureteral stenting and subsequent infection. This happened at the [place]. He was treated with adenosine and IV Cardizem  He has poorly controlled hypertension  Denies Chest pain edema  Daytime somnolence and sleep disordered breathing     Past Medical History  Diagnosis Date  . Hypertension   . Chronic pain     secondary to trauma      Surgical History:  Past Surgical History  Procedure Laterality Date  . Orthopedic surgery    . Craniotomy       Home Meds: Prior to Admission medications   Medication Sig Start Date End Date Taking? Authorizing Provider  AMLODIPINE BESYLATE PO Take 1 tablet by mouth every morning.   Yes Historical Provider, MD  buPROPion (WELLBUTRIN SR) 100 MG 12 hr tablet Take 200 mg by mouth 2 (two) times daily. 02/08/16  Yes Historical Provider, MD  citalopram (CELEXA) 40 MG tablet Take 40 mg by mouth daily. 02/22/16  Yes Historical Provider, MD  cyclobenzaprine (FLEXERIL) 5 MG tablet Take 1 tablet by mouth 3 (three) times daily as needed. 04/16/16  Yes Historical Provider, MD  losartan (COZAAR) 100 MG tablet Take 1 tablet by mouth daily. 04/15/16  Yes Historical Provider, MD  metoprolol tartrate (LOPRESSOR) 25 MG tablet Take 1  tablet by mouth daily. 04/28/16  Yes Historical Provider, MD  MORPHINE SULFATE PO Take 1 tablet by mouth as needed.   Yes Historical Provider, MD  oxybutynin (DITROPAN) 5 MG tablet Take 5 mg by mouth 4 (four) times daily - after meals and at bedtime. 02/15/16  Yes Historical Provider, MD  oxycodone (OXY-IR) 5 MG capsule Take 5 mg by mouth every 4 (four) hours as needed.   Yes Historical Provider, MD  potassium chloride SA (K-DUR,KLOR-CON) 20 MEQ tablet Take 20 mEq by mouth daily. 03/03/16  Yes Historical Provider, MD  doxycycline (MONODOX) 100 MG capsule Take 1 capsule by mouth 2 (two) times daily. Reported on 05/15/2016 04/28/16   Historical Provider, MD  levofloxacin (LEVAQUIN) 750 MG tablet Take 1 tablet by mouth daily. Reported on 05/15/2016 04/28/16   Historical Provider, MD  naproxen (NAPROSYN) 500 MG tablet Take 500 mg by mouth 2 (two) times daily. Reported on 05/15/2016 04/16/16   Historical Provider, MD  prazosin (MINIPRESS) 5 MG capsule Take 1 capsule by mouth 2 (two) times daily. Reported on 05/15/2016 02/08/16   Historical Provider, MD  sulfamethoxazole-trimethoprim (BACTRIM DS,SEPTRA DS) 800-160 MG tablet Take 1 tablet by mouth 2 (two) times daily. Reported on 05/15/2016 04/16/16   Historical Provider, MD    Inpatient Medications:  . buPROPion  200 mg Oral BID  . citalopram  40 mg Oral Daily  . diltiazem  60 mg Oral Q8H  . docusate sodium  100 mg Oral BID  .  heparin  5,000 Units Subcutaneous Q8H  . losartan  100 mg Oral Daily  . metoprolol tartrate  25 mg Oral Daily  . oxybutynin  5 mg Oral TID PC & HS  . pantoprazole  40 mg Oral BID  . potassium chloride SA  20 mEq Oral Daily  . prazosin  5 mg Oral BID  . sodium chloride flush  3 mL Intravenous Q12H        Allergies: No Known Allergies  Social History   Social History  . Marital Status: Married    Spouse Name: N/A  . Number of Children: N/A  . Years of Education: N/A   Occupational History  . Not on file.   Social History  Main Topics  . Smoking status: Never Smoker   . Smokeless tobacco: Not on file  . Alcohol Use: Not on file  . Drug Use: Not on file  . Sexual Activity: Not on file   Other Topics Concern  . Not on file   Social History Narrative  . No narrative on file     Family History  Problem Relation Age of Onset  . Hypertension Father     ROS:  Please see the history of present illness.     All other systems reviewed and negative.    Physical Exam: Blood pressure 128/66, pulse 46, temperature 98.1 F (36.7 C), temperature source Oral, resp. rate 18, height 5\' 8"  (1.727 m), weight 230 lb (104.327 kg), SpO2 98 %. General: Well developed, well nourished male in no acute distress. Head: Normocephalic, atraumatic, sclera non-icteric, no xanthomas, nares are without discharge. EENT: normal Lymph Nodes:  none Back: without scoliosis/kyphosis, no CVA tendersness Neck: Negative for carotid bruits. JVD 8. Lungs: Clear bilaterally to auscultation without wheezes, rales, or rhonchi. Breathing is unlabored. Heart: RRR with S1 S2.  2/6 systolic  murmur , rubs, or gallops appreciated. Abdomen: Soft, non-tender, non-distended with normoactive bowel sounds. No hepatomegaly. No rebound/guarding. No obvious abdominal masses. Msk:  Strength and tone appear normal for age. Extremities: No clubbing or cyanosis.  Tr+  edema.  Distal pedal pulses are 2+ and equal bilaterally. Skin: Warm and Dry Neuro: Alert and oriented X 3. CN III-XII intact Grossly normal sensory and motor function . Psych:  Responds to questions appropriately with a normal affect.      Labs: Cardiac Enzymes  Recent Labs  05/15/16 0030 05/15/16 0339 05/15/16 0851  TROPONINI 0.24* 0.27* 0.31*   CBC Lab Results  Component Value Date   WBC 6.0 05/15/2016   HGB 11.3* 05/15/2016   HCT 32.3* 05/15/2016   MCV 82.9 05/15/2016   PLT 153 05/15/2016   PROTIME: No results for input(s): LABPROT, INR in the last 72 hours. Chemistry    Recent Labs Lab 05/15/16 0030 05/15/16 0851  NA 138  --   K 2.8* 3.2*  CL 105  --   CO2 24  --   BUN 25*  --   CREATININE 1.89*  --   CALCIUM 8.7*  --   PROT 6.4*  --   BILITOT <0.1*  --   ALKPHOS 102  --   ALT 31  --   AST 31  --   GLUCOSE 161*  --    Lipids No results found for: CHOL, HDL, LDLCALC, TRIG BNP No results found for: PROBNP Thyroid Function Tests:  Recent Labs  05/15/16 0030  TSH 1.141      Miscellaneous No results found for: DDIMER  Radiology/Studies:  No results  found.  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 I agree with the plan as initiated that is to stop his amlodipine and put him on long-acting calcium blockers. I always worry a little bit about the combination of AV nodal slowing calcium blockers in conjunction with beta blockers. Hence, I would be inclined to stop the metoprolol and push  his Cardizem to the 240 range.  It is reasonable to undertake stress testing given his troponin abnormality. It can be done as an outpatient.  I agree with having order the echo.  I discussed with them catheter ablation. I suspect this is his best treatment option. He is connected with the [facility] and he has an appointment within next week. He will try and get EP follow-up. We are glad to see him if needed.      Virl Axe

## 2016-05-15 NOTE — H&P (Signed)
Javier Watson is an 70 y.o. male.   Chief Complaint: Palpitations  HPI: The patient with past medical history of hypertension presents to the emergency department complaining of palpitations. This is the second night in a row he has felt a racing heartbeat. Before he had attributed his elevated heart rate to some of the food he ate for dinner. The first occasion resolved when he took a little pill prescribed to him by his [place] doctor for his heart rate (diltiazem?). Tonight the patient was somewhat more concerned about the recurrence of his symptoms and did not take this medication prior to arrival. In the emergency department the patient was found to be in SVT and received 3 doses of stacked adenosine dosing. His tachycardia arrhythmia would briefly resolve then return to SVT with heart rate approximately 150. Finally he was given diltiazem 10 mg IV which broke his SVT. Throughout this time the patient denies any chest pain, shortness of breath, nausea, vomiting or lightheadedness. Notably, the patient denies any history of heart problems prior to an admission to the [facility] 2 weeks ago when he was diagnosed with urinary tract infection following ureteral stent placement and then with [facility] spotted fever. The patient has taken antibiotics for both infections. Laboratory evaluation tonight reveals mildly elevated troponin. EKG showed ST depressions in the lateral leads when the patient was in SVT. Currently telemetry is normal. Following stabilization the emergency department staff called the hospitalist service for admission.  Past Medical History  Diagnosis Date  . Hypertension   . 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 Orthopedic surgery    . Craniotomy      Family History  Problem Relation Age of Onset  . Hypertension Father    Social History:  has no tobacco, alcohol, and drug history on  file.  Allergies: No Known Allergies  Prior to Admission medications   Medication Sig Start Date End Date Taking? Authorizing Provider  AMLODIPINE BESYLATE PO Take 1 tablet by mouth every morning.   Yes Historical Provider, MD  buPROPion (WELLBUTRIN SR) 100 MG 12 hr tablet Take 200 mg by mouth 2 (two) times daily. 02/08/16  Yes Historical Provider, MD  citalopram (CELEXA) 40 MG tablet Take 40 mg by mouth daily. 02/22/16  Yes Historical Provider, MD  cyclobenzaprine (FLEXERIL) 5 MG tablet Take 1 tablet by mouth 3 (three) times daily as needed. 04/16/16  Yes Historical Provider, MD  losartan (COZAAR) 100 MG tablet Take 1 tablet by mouth daily. 04/15/16  Yes Historical Provider, MD  metoprolol tartrate (LOPRESSOR) 25 MG tablet Take 1 tablet by mouth daily. 04/28/16  Yes Historical Provider, MD  MORPHINE SULFATE PO Take 1 tablet by mouth as needed.   Yes Historical Provider, MD  oxybutynin (DITROPAN) 5 MG tablet Take 5 mg by mouth 4 (four) times daily - after meals and at bedtime. 02/15/16  Yes Historical Provider, MD  oxycodone (OXY-IR) 5 MG capsule Take 5 mg by mouth every 4 (four) hours as needed.   Yes Historical Provider, MD  potassium chloride SA (K-DUR,KLOR-CON) 20 MEQ tablet Take 20 mEq by mouth daily. 03/03/16  Yes Historical Provider, MD  doxycycline (MONODOX) 100 MG capsule Take 1 capsule by mouth 2 (two) times daily. Reported on 05/15/2016 04/28/16   Historical Provider, MD  levofloxacin (LEVAQUIN) 750 MG tablet Take 1 tablet by mouth daily. Reported on 05/15/2016 04/28/16   Historical Provider, MD  naproxen (NAPROSYN) 500 MG tablet Take 500 mg by mouth 2 (two) times daily. Reported on 05/15/2016 04/16/16   Historical Provider, MD  prazosin (MINIPRESS) 5 MG capsule Take 1 capsule by mouth 2 (two) times daily. Reported on 05/15/2016 02/08/16   Historical Provider, MD  sulfamethoxazole-trimethoprim (BACTRIM DS,SEPTRA DS) 800-160 MG tablet Take 1 tablet by mouth 2 (two) times daily. Reported on 05/15/2016  04/16/16   Historical Provider, MD     Results for orders placed or performed during the hospital encounter of 05/15/16 (from the past 48 hour(s))  CBC with Differential     Status: Abnormal   Collection Time: 05/15/16 12:30 AM  Result Value Ref Range   WBC 6.0 3.8 - 10.6 K/uL   RBC 3.90 (L) 4.40 - 5.90 MIL/uL   Hemoglobin 11.3 (L) 13.0 - 18.0 g/dL   HCT 32.3 (L) 40.0 - 52.0 %   MCV 82.9 80.0 - 100.0 fL   MCH 29.1 26.0 - 34.0 pg   MCHC 35.1 32.0 - 36.0 g/dL   RDW 16.9 (H) 11.5 - 14.5 %   Platelets 153 150 - 440 K/uL   Neutrophils Relative % 51 %   Neutro Abs 3.1 1.4 - 6.5 K/uL   Lymphocytes Relative 34 %   Lymphs Abs 2.1 1.0 - 3.6 K/uL   Monocytes Relative 12 %   Monocytes Absolute 0.7 0.2 - 1.0 K/uL   Eosinophils Relative 2 %   Eosinophils Absolute 0.1 0 - 0.7 K/uL   Basophils Relative 1 %   Basophils Absolute 0.0 0 - 0.1 K/uL  Comprehensive metabolic panel     Status: Abnormal   Collection Time: 05/15/16 12:30 AM  Result Value Ref Range   Sodium 138 135 - 145 mmol/L   Potassium 2.8 (L) 3.5 - 5.1 mmol/L   Chloride 105 101 - 111 mmol/L   CO2 24 22 - 32 mmol/L   Glucose, Bld 161 (H) 65 - 99 mg/dL   BUN 25 (H) 6 - 20 mg/dL   Creatinine, Ser 1.89 (H) 0.61 - 1.24 mg/dL   Calcium 8.7 (L) 8.9 - 10.3 mg/dL   Total Protein 6.4 (L) 6.5 - 8.1 g/dL   Albumin 3.1 (L) 3.5 - 5.0 g/dL   AST 31 15 - 41 U/L   ALT 31 17 - 63 U/L   Alkaline Phosphatase 102 38 - 126 U/L   Total Bilirubin <0.1 (L) 0.3 - 1.2 mg/dL   GFR calc non Af Amer 35 (L) >60 mL/min   GFR calc Af Amer 40 (L) >60 mL/min    Comment: (NOTE) The eGFR has been calculated using the CKD EPI equation. This calculation has not been validated in all clinical situations. eGFR's persistently <60 mL/min signify possible Chronic Kidney Disease.    Anion gap 9 5 - 15  Troponin I     Status: Abnormal   Collection Time: 05/15/16 12:30 AM  Result Value Ref Range   Troponin I 0.24 (HH) <0.03 ng/mL  Troponin I     Status: Abnormal    Collection Time: 05/15/16  3:39 AM  Result Value Ref Range   Troponin I 0.27 (HH) <0.03 ng/mL    Comment: CRITICAL VALUE NOTED.  VALUE IS CONSISTENT WITH PREVIOUSLY REPORTED AND CALLED VALUE. tlb    No results found.  Review of Systems  Constitutional: Negative for fever and chills.  HENT: Negative for sore throat and tinnitus.   Eyes: Negative for blurred vision and redness.  Respiratory: Negative for cough and shortness of  breath.   Cardiovascular: Positive for palpitations. Negative for chest pain, orthopnea and PND.  Gastrointestinal: Negative for nausea, vomiting, abdominal pain and diarrhea.  Genitourinary: Negative for dysuria, urgency and frequency.  Musculoskeletal: Negative for myalgias and joint pain.  Skin: Negative for rash.       No lesions  Neurological: Negative for speech change, focal weakness and weakness.  Endo/Heme/Allergies: Does not bruise/bleed easily.       No temperature intolerance  Psychiatric/Behavioral: Negative for depression and suicidal ideas.    Blood pressure 155/88, pulse 80, temperature 98 F (36.7 C), temperature source Oral, resp. rate 16, height _0  (1.727 m), weight 104.327 kg (230 lb), SpO2 99 %. Physical Exam  Nursing note and vitals reviewed. Constitutional: He is oriented to person, place, and time. He appears well-developed and well-nourished. No distress.  HENT:  Head: Normocephalic and atraumatic.  Mouth/Throat: Oropharynx is clear and moist.  Eyes: Conjunctivae and EOM are normal. Pupils are equal, round, and reactive to light. No scleral icterus.  Neck: Normal range of motion. Neck supple. No JVD present. No tracheal deviation present. No thyromegaly present.  Cardiovascular: Normal rate, regular rhythm and normal heart sounds.  Exam reveals no gallop and no friction rub.   No murmur heard. Respiratory: Breath sounds normal. No respiratory distress.  GI: Soft. Bowel sounds are normal. He exhibits no distension.   Genitourinary:  Deferred  Musculoskeletal: Normal range of motion. He exhibits edema (1+ ankle L>R).  Lymphadenopathy:    He has no cervical adenopathy.  Neurological: He is alert and oriented to person, place, and time. No cranial nerve deficit.  Skin: Skin is warm and dry. No rash noted. No erythema.  Psychiatric: He has a normal mood and affect. His behavior is normal. Judgment and thought content normal.     Assessment/Plan This is a 70 year old male admitted for SVT. 1. SVT: Resolved; start scheduled diltiazem. Titrate dose to calculate total daily dose needed for long-acting antiarrhythmic dosing. Prior to arrival to the floor the patient had an episode of nonsustained ventricular tachycardia. He was a symptomatic. History of palpitations after eating may indicate acid reflux as the catalyst for her cardial inflammation and secondary tachyarrhythmia. I have started pantoprazole. Cardiology is consulted. Replete potassium 2. Essential hypertension: Continue valsartan, metoprolol and now diltiazem. (I have discontinued amlodipine for now as this is also a calcium channel blocker). Labetalol as needed 3. Urinary retention: Continue oxybutynin and prazosin 4. Chronic pain: Secondary to trauma while in [employer]. Continue home regimen of narcotics and Flexeril 5. Depression: Continue bupropion and citalopram 6. DVT prophylaxis: Heparin 7. GI prophylaxis: As above The patient is a full code. Time spent on admission orders and patient care approximately 45 minutes  Harrie Foreman, MD 05/15/2016, 5:17 AM

## 2016-05-15 NOTE — ED Provider Notes (Signed)
[facility] Emergency Department Provider Note  ____________________________________________  Time seen: 12:05 AM  I have reviewed the triage vital signs and the nursing notes.   HISTORY  Chief Complaint Tachycardia      HPI ALEGANDRO VANKAMPEN is a 70 y.o. male with history of "abnormal heartbeat" presents with sudden onset of sensation of his heart racing approximately one hour before presentation. Patient denies any chest pain no shortness of breath no dizziness or weakness. Patient states that he had 2 previous episodes of the same with onset early this year. Most recent episode 2 weeks ago for which he was evaluated at the [facility].   Past medical history  Patient Active Problem List   Diagnosis Date Noted  . NSTEMI (non-ST elevated myocardial infarction) ([place]) 05/15/2016    No past surgical history on file.  Current Outpatient Rx  Name  Route  Sig  Dispense  Refill  . AMLODIPINE BESYLATE PO   Oral   Take 1 tablet by mouth every morning.         Marland Kitchen buPROPion (WELLBUTRIN SR) 100 MG 12 hr tablet   Oral   Take 200 mg by mouth 2 (two) times daily.         . citalopram (CELEXA) 40 MG tablet   Oral   Take 40 mg by mouth daily.         . cyclobenzaprine (FLEXERIL) 5 MG tablet   Oral   Take 1 tablet by mouth 3 (three) times daily as needed.      0   . losartan (COZAAR) 100 MG tablet   Oral   Take 1 tablet by mouth daily.         . metoprolol tartrate (LOPRESSOR) 25 MG tablet   Oral   Take 1 tablet by mouth daily.         . MORPHINE SULFATE PO   Oral   Take 1 tablet by mouth as needed.         Marland Kitchen oxybutynin (DITROPAN) 5 MG tablet   Oral   Take 5 mg by mouth 4 (four) times daily - after meals and at bedtime.         Marland Kitchen oxycodone (OXY-IR) 5 MG capsule   Oral   Take 5 mg by mouth every 4 (four) hours as needed.         . potassium chloride SA (K-DUR,KLOR-CON) 20 MEQ tablet   Oral   Take 20 mEq by mouth daily.          Marland Kitchen doxycycline (MONODOX) 100 MG capsule   Oral   Take 1 capsule by mouth 2 (two) times daily. Reported on 05/15/2016         . levofloxacin (LEVAQUIN) 750 MG tablet   Oral   Take 1 tablet by mouth daily. Reported on 05/15/2016         . naproxen (NAPROSYN) 500 MG tablet   Oral   Take 500 mg by mouth 2 (two) times daily. Reported on 05/15/2016      0   . prazosin (MINIPRESS) 5 MG capsule   Oral   Take 1 capsule by mouth 2 (two) times daily. Reported on 05/15/2016         . sulfamethoxazole-trimethoprim (BACTRIM DS,SEPTRA DS) 800-160 MG tablet   Oral   Take 1 tablet by mouth 2 (two) times daily. Reported on 05/15/2016      0     Allergies No known drug allergies No family  history on file.  Social History Social History  Substance Use Topics  . Smoking status: Not on file  . Smokeless tobacco: Not on file  . Alcohol Use: Not on file    Review of Systems  Constitutional: Negative for fever. Eyes: Negative for visual changes. ENT: Negative for sore throat. Cardiovascular: Negative for chest pain.Positive for palpitations Respiratory: Negative for shortness of breath. Gastrointestinal: Negative for abdominal pain, vomiting and diarrhea. Genitourinary: Negative for dysuria. Musculoskeletal: Negative for back pain. Skin: Negative for rash. Neurological: Negative for headaches, focal weakness or numbness.   10-point ROS otherwise negative.  ____________________________________________   PHYSICAL EXAM:  VITAL SIGNS: ED Triage Vitals  Enc Vitals Group     BP 05/15/16 0004 100/52 mmHg     Pulse Rate 05/15/16 0004 143     Resp 05/15/16 0004 20     Temp 05/15/16 0004 98 F (36.7 C)     Temp Source 05/15/16 0004 Oral     SpO2 05/15/16 0004 100 %     Weight 05/15/16 0004 230 lb (104.327 kg)     Height 05/15/16 0004 5\' 8"  (1.727 m)     Head Cir --      Peak Flow --      Pain Score 05/15/16 0042 0     Pain Loc --      Pain Edu? --      Excl. in [place]? --       Constitutional: Alert and oriented. Well appearing and in no distress. Eyes: Conjunctivae are normal. PERRL. Normal extraocular movements. ENT   Head: Normocephalic and atraumatic.   Nose: No congestion/rhinnorhea.   Mouth/Throat: Mucous membranes are moist.   Neck: No stridor. Hematological/Lymphatic/Immunilogical: No cervical lymphadenopathy. Cardiovascular: Tachycardia Normal and symmetric distal pulses are present in all extremities. No murmurs, rubs, or gallops. Respiratory: Normal respiratory effort without tachypnea nor retractions. Breath sounds are clear and equal bilaterally. No wheezes/rales/rhonchi. Gastrointestinal: Soft and nontender. No distention. There is no CVA tenderness. Genitourinary: deferred Musculoskeletal: Nontender with normal range of motion in all extremities. No joint effusions.  No lower extremity tenderness nor edema. Neurologic:  Normal speech and language. No gross focal neurologic deficits are appreciated. Speech is normal.  Skin:  Skin is warm, dry and intact. No rash noted. Psychiatric: Mood and affect are normal. Speech and behavior are normal. Patient exhibits appropriate insight and judgment.  ____________________________________________    LABS (pertinent positives/negatives)  Labs Reviewed  CBC WITH DIFFERENTIAL/PLATELET - Abnormal; Notable for the following:    RBC 3.90 (*)    Hemoglobin 11.3 (*)    HCT 32.3 (*)    RDW 16.9 (*)    All other components within normal limits  COMPREHENSIVE METABOLIC PANEL - Abnormal; Notable for the following:    Potassium 2.8 (*)    Glucose, Bld 161 (*)    BUN 25 (*)    Creatinine, Ser 1.89 (*)    Calcium 8.7 (*)    Total Protein 6.4 (*)    Albumin 3.1 (*)    Total Bilirubin <0.1 (*)    GFR calc non Af Amer 35 (*)    GFR calc Af Amer 40 (*)    All other components within normal limits  TROPONIN I - Abnormal; Notable for the following:    Troponin I 0.24 (*)    All other components  within normal limits  TROPONIN I - Abnormal; Notable for the following:    Troponin I 0.27 (*)    All other components  within normal limits     ____________________________________________   EKG  ED ECG REPORT I, [place] N BROWN, the attending physician, personally viewed and interpreted this ECG.   Date: 05/15/2016  EKG Time: 11:56PM  Rate: 151  Rhythm: Supraventricular tachycardia  Axis: Normal  Intervals: Normal  ST&T Change: None      Procedures   Critical Care performed: CRITICAL CARE Performed by: Gregor Hams   Total critical care time: 45 minutes  Critical care time was exclusive of separately billable procedures and treating other patients.  Critical care was necessary to treat or prevent imminent or life-threatening deterioration.  Critical care was time spent personally by me on the following activities: development of treatment plan with patient and/or surrogate as well as nursing, discussions with consultants, evaluation of patient's response to treatment, examination of patient, obtaining history from patient or surrogate, ordering and performing treatments and interventions, ordering and review of laboratory studies, ordering and review of radiographic studies, pulse oximetry and re-evaluation of patient's condition.   ____________________________________________   INITIAL IMPRESSION / ASSESSMENT AND PLAN / ED COURSE  Pertinent labs & imaging results that were available during my care of the patient were reviewed by me and considered in my medical decision making (see chart for details).  Patient received adenosine 6 mg with brief resolution of SVT with rapid return to SVT. Same results were achieved with adenosine 12 mg 2. Diltiazem 10mg  IV given with resolution of SVT. Patient discussed with Dr. Marcille Blanco for hospital admission for further evaluation and management.   ____________________________________________   FINAL CLINICAL  IMPRESSION(S) / ED DIAGNOSES  Final diagnoses:  None  Supraventricular tachycardia   Gregor Hams, MD 05/15/16 [phone]

## 2016-05-15 NOTE — Progress Notes (Signed)
Pt placed on [facility] CPAP C-4 for sleep. CPAP is plugged into red outlet. Pt does not require O2. Pt is tolerating well.

## 2016-05-16 ENCOUNTER — Encounter: Payer: Self-pay | Admitting: Nurse Practitioner

## 2016-05-16 DIAGNOSIS — R7303 Prediabetes: Secondary | ICD-10-CM

## 2016-05-16 DIAGNOSIS — E876 Hypokalemia: Secondary | ICD-10-CM

## 2016-05-16 DIAGNOSIS — G4733 Obstructive sleep apnea (adult) (pediatric): Secondary | ICD-10-CM

## 2016-05-16 DIAGNOSIS — I10 Essential (primary) hypertension: Secondary | ICD-10-CM | POA: Diagnosis present

## 2016-05-16 DIAGNOSIS — K219 Gastro-esophageal reflux disease without esophagitis: Secondary | ICD-10-CM

## 2016-05-16 DIAGNOSIS — N400 Enlarged prostate without lower urinary tract symptoms: Secondary | ICD-10-CM

## 2016-05-16 DIAGNOSIS — R7989 Other specified abnormal findings of blood chemistry: Secondary | ICD-10-CM

## 2016-05-16 DIAGNOSIS — R778 Other specified abnormalities of plasma proteins: Secondary | ICD-10-CM

## 2016-05-16 DIAGNOSIS — R739 Hyperglycemia, unspecified: Secondary | ICD-10-CM | POA: Insufficient documentation

## 2016-05-16 DIAGNOSIS — I471 Supraventricular tachycardia: Secondary | ICD-10-CM

## 2016-05-16 DIAGNOSIS — N179 Acute kidney failure, unspecified: Secondary | ICD-10-CM

## 2016-05-16 LAB — BASIC METABOLIC PANEL
Anion gap: 6 (ref 5–15)
BUN: 19 mg/dL (ref 6–20)
CO2: 25 mmol/L (ref 22–32)
Calcium: 8.4 mg/dL — ABNORMAL LOW (ref 8.9–10.3)
Chloride: 109 mmol/L (ref 101–111)
Creatinine, Ser: 1.56 mg/dL — ABNORMAL HIGH (ref 0.61–1.24)
GFR calc Af Amer: 51 mL/min — ABNORMAL LOW (ref >60)
GFR calc non Af Amer: 44 mL/min — ABNORMAL LOW (ref >60)
Glucose, Bld: 103 mg/dL — ABNORMAL HIGH (ref 65–99)
Potassium: 3.6 mmol/L (ref 3.5–5.1)
Sodium: 140 mmol/L (ref 135–145)

## 2016-05-16 LAB — MAGNESIUM: Magnesium: 2 mg/dL (ref 1.7–2.4)

## 2016-05-16 MED ORDER — ASPIRIN 81 MG PO CHEW
81.0000 mg | CHEWABLE_TABLET | Freq: Every day | ORAL | Status: DC
  Administered 2016-05-16: 81 mg via ORAL
  Filled 2016-05-16: qty 1

## 2016-05-16 MED ORDER — AMLODIPINE BESYLATE 10 MG PO TABS: 10.0000 mg | ORAL_TABLET | Freq: Every morning | ORAL | Status: DC

## 2016-05-16 MED ORDER — POTASSIUM CHLORIDE CRYS ER 20 MEQ PO TBCR: 20.0000 meq | EXTENDED_RELEASE_TABLET | Freq: Two times a day (BID) | ORAL | Status: DC

## 2016-05-16 MED ORDER — DILTIAZEM HCL ER COATED BEADS 120 MG PO CP24: 120.0000 mg | ORAL_CAPSULE | Freq: Two times a day (BID) | ORAL | Status: DC

## 2016-05-16 MED ORDER — ASPIRIN 81 MG PO CHEW: 81.0000 mg | CHEWABLE_TABLET | Freq: Every day | ORAL | Status: DC

## 2016-05-16 MED ORDER — POTASSIUM CHLORIDE CRYS ER 20 MEQ PO TBCR: 20.0000 meq | EXTENDED_RELEASE_TABLET | Freq: Every day | ORAL | Status: DC

## 2016-05-16 MED ORDER — HYDRALAZINE HCL 10 MG PO TABS
10.0000 mg | ORAL_TABLET | Freq: Three times a day (TID) | ORAL | Status: DC
  Administered 2016-05-16: 10 mg via ORAL
  Filled 2016-05-16 (×3): qty 1

## 2016-05-16 MED ORDER — HYDRALAZINE HCL 10 MG PO TABS: 10.0000 mg | ORAL_TABLET | Freq: Three times a day (TID) | ORAL | Status: DC

## 2016-05-16 MED ORDER — LOSARTAN POTASSIUM 100 MG PO TABS: 100.0000 mg | ORAL_TABLET | Freq: Every day | ORAL | Status: AC

## 2016-05-16 NOTE — Progress Notes (Signed)
MEDICATION RELATED CONSULT NOTE - INITIAL   Pharmacy Consult for Electrolyte Management Indication: Hypokalemia  No Known Allergies  Patient Measurements: Height: 5\' 8"  (172.7 cm) Weight: 244 lb 11.2 oz (110.995 kg) IBW/kg (Calculated) : 68.4 Adjusted Body Weight: 82.7 kg  Vital Signs: Temp: 98.2 F (36.8 C) (06/30 0816) Temp Source: Oral (06/30 0816) BP: 191/100 mmHg (06/30 0816) Pulse Rate: 72 (06/30 0816) Intake/Output from previous day: 06/29 0701 - 06/30 0700 In: 1654.8 [P.O.:480; I.V.:1174.8] Out: 1850 [Urine:1850] Intake/Output from this shift: Total I/O In: 240 [P.O.:240] Out: 250 [Urine:250]  Labs:  Recent Labs  05/15/16 0030 05/15/16 0851 05/16/16 0409  WBC 6.0  --   --   HGB 11.3*  --   --   HCT 32.3*  --   --   PLT 153  --   --   CREATININE 1.89*  --  1.56*  MG  --  1.6* 2.0  PHOS  --  3.4  --   ALBUMIN 3.1*  --   --   PROT 6.4*  --   --   AST 31  --   --   ALT 31  --   --   ALKPHOS 102  --   --   BILITOT <0.1*  --   --    Estimated Creatinine Clearance: 54 mL/min (by C-G formula based on Cr of 1.56).   Microbiology: No results found for this or any previous visit (from the past 720 hour(s)).  Medical History: Past Medical History  Diagnosis Date  . Essential hypertension   . 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 buPROPion  200 mg Oral BID  . citalopram  40 mg Oral Daily  . diltiazem  120 mg Oral BID  . docusate sodium  100 mg Oral BID  . heparin  5,000 Units Subcutaneous Q8H  . losartan  100 mg Oral Daily  . oxybutynin  5 mg Oral TID PC & HS  . pantoprazole  40 mg Oral BID  . potassium chloride SA  20 mEq Oral Daily  . prazosin  5 mg Oral BID  . sodium chloride flush  3 mL Intravenous Q12H   Infusions:  . 0.9 % NaCl with KCl 40 mEq / L 125 mL/hr (05/15/16 2116)    Assessment: Patient admitted for possible NSTEMI. Hypokalemic on admission with K of 2.8, received KCl 59mEq PO x 2 and initiated  on IV with KCl.   Currently ordered KCl 55mEq PO daily in addition to IV fluid supplementation.   Goal of Therapy:  Keep electrolyes WNL  Plan:  K=3.6, Mg 2.0, No supplementation needed. Follow up labs in the AM. 0.9% NACL w/ KCL 15mEq/L still infusing at 135ml/hr and patient still receiving 93mEq daily. Will continue to monitor with AM labs.   Nancy Fetter, PharmD Clinical Pharmacist 05/16/2016 10:37 AM

## 2016-05-16 NOTE — Discharge Summary (Signed)
[place] [place] NAME: Javier Watson    MR#:  [id number]  DATE OF BIRTH:  Dec 16, 1945  DATE OF ADMISSION:  05/15/2016 ADMITTING PHYSICIAN: Harrie Foreman, MD  DATE OF DISCHARGE: 05/16/2016  PRIMARY CARE PHYSICIAN: PROVIDER NOT IN SYSTEM    ADMISSION DIAGNOSIS:  [place]  DISCHARGE DIAGNOSIS:  Principal Problem:   SVT (supraventricular tachycardia) (HCC) Active Problems:   Elevated troponin   Essential hypertension   Acute kidney injury ([place])   Borderline diabetes   BPH (benign prostatic hyperplasia)   GERD (gastroesophageal reflux disease)   Hypokalemia   Hypomagnesemia   Obstructive sleep apnea   SECONDARY DIAGNOSIS:   Past Medical History  Diagnosis Date  . Essential hypertension   . 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  1. SVT in the setting of hypokalemia and hypomagnesium:  ECHO shows normal EF without WMA or significant valvular abnormalities. Continue diltiazem. Patient will follow-up at [facility] for further evaluation. [place] cardiology consultation. Patient would benefit from radiofrequency ablation and he plans to discuss this with his cardiologist..   2. Elevated troponin: This is due to SVT and not ACS. He would benefit from outpatient stress test..  3. Essential hypertension: Cardiology is recommending diltiazem and hydralazine for blood pressure.  4. BPH: Continue Minipress and Ditropan.  5. GERD: Continue PPI.  6. Electrolyte abnormalities including potassium and magnesium: Repleted and normalized this morning 7. Borderline diabetes with A1c of 6.2: Patient would benefit from outpatient evaluation with lifestyle modifications and weight loss. He can follow-[facility] at [facility] for this.  DISCHARGE CONDITIONS AND DIET:   Stable on heart healthy diet  CONSULTS OBTAINED:  Treatment Team:  Minna Merritts, MD  DRUG  ALLERGIES:  No Known Allergies  DISCHARGE MEDICATIONS:   Current Discharge Medication List    START taking these medications   Details  diltiazem (CARDIZEM CD) 120 MG 24 hr capsule Take 1 capsule (120 mg total) by mouth 2 (two) times daily. Qty: 60 capsule, Refills: 0    hydrALAZINE (APRESOLINE) 10 MG tablet Take 1 tablet (10 mg total) by mouth 3 (three) times daily. Qty: 90 tablet, Refills: 0      CONTINUE these medications which have CHANGED   Details  losartan (COZAAR) 100 MG tablet Take 1 tablet (100 mg total) by mouth at bedtime. Qty: 30 tablet, Refills: 0      CONTINUE these medications which have NOT CHANGED   Details  buPROPion (WELLBUTRIN SR) 100 MG 12 hr tablet Take 200 mg by mouth 2 (two) times daily.    citalopram (CELEXA) 40 MG tablet Take 40 mg by mouth daily.    cyclobenzaprine (FLEXERIL) 5 MG tablet Take 1 tablet by mouth 3 (three) times daily as needed. Refills: 0    MORPHINE SULFATE PO Take 1 tablet by mouth as needed.    oxybutynin (DITROPAN) 5 MG tablet Take 5 mg by mouth 4 (four) times daily - after meals and at bedtime.    oxycodone (OXY-IR) 5 MG capsule Take 5 mg by mouth every 4 (four) hours as needed.    potassium chloride SA (K-DUR,KLOR-CON) 20 MEQ tablet Take 20 mEq by mouth daily.    prazosin (MINIPRESS) 5 MG capsule Take 1 capsule by mouth 2 (two) times daily. Reported on 05/15/2016      STOP taking these medications     metoprolol  tartrate (LOPRESSOR) 25 MG tablet      AMLODIPINE BESYLATE PO      doxycycline (MONODOX) 100 MG capsule      levofloxacin (LEVAQUIN) 750 MG tablet      naproxen (NAPROSYN) 500 MG tablet      sulfamethoxazole-trimethoprim (BACTRIM DS,SEPTRA DS) 800-160 MG tablet               Today   CHIEF COMPLAINT:  Doing well this point. Ready for discharge. Blood pressure was elevated this morning prior to a.m. medications.   VITAL SIGNS:  Blood pressure 196/88, pulse 77, temperature 98.4 F (36.9 C),  temperature source Oral, resp. rate 18, height 5\' 8"  (1.727 m), weight 110.995 kg (244 lb 11.2 oz), SpO2 98 %.   REVIEW OF SYSTEMS:  Review of Systems  Constitutional: Negative for fever, chills and malaise/fatigue.  HENT: Negative for ear discharge, ear pain, hearing loss, nosebleeds and sore throat.   Eyes: Negative for blurred vision and pain.  Respiratory: Negative for cough, hemoptysis, shortness of breath and wheezing.   Cardiovascular: Negative for chest pain, palpitations and leg swelling.  Gastrointestinal: Negative for nausea, vomiting, abdominal pain, diarrhea and blood in stool.  Genitourinary: Negative for dysuria.  Musculoskeletal: Negative for back pain.  Neurological: Negative for dizziness, tremors, speech change, focal weakness, seizures and headaches.  Endo/Heme/Allergies: Does not bruise/bleed easily.  Psychiatric/Behavioral: Negative for depression, suicidal ideas and hallucinations.     PHYSICAL EXAMINATION:  GENERAL:  70 y.o.-year-old patient lying in the bed with no acute distress. Obese NECK:  Supple, no jugular venous distention. No thyroid enlargement, no tenderness.  LUNGS: Normal breath sounds bilaterally, no wheezing, rales,rhonchi  No use of accessory muscles of respiration.  CARDIOVASCULAR: S1, S2 normal. No murmurs, rubs, or gallops.  ABDOMEN: Soft, non-tender, non-distended. Bowel sounds present. No organomegaly or mass.  EXTREMITIES: No pedal edema, cyanosis, or clubbing.  PSYCHIATRIC: The patient is alert and oriented x 3.  SKIN: No obvious rash, lesion, or ulcer.   DATA REVIEW:   CBC  Recent Labs Lab 05/15/16 0030  WBC 6.0  HGB 11.3*  HCT 32.3*  PLT 153    Chemistries   Recent Labs Lab 05/15/16 0030  05/16/16 0409  NA 138  --  140  K 2.8*  < > 3.6  CL 105  --  109  CO2 24  --  25  GLUCOSE 161*  --  103*  BUN 25*  --  19  CREATININE 1.89*  --  1.56*  CALCIUM 8.7*  --  8.4*  MG  --   < > 2.0  AST 31  --   --   ALT 31  --    --   ALKPHOS 102  --   --   BILITOT <0.1*  --   --   < > = values in this interval not displayed.  Cardiac Enzymes  Recent Labs Lab 05/15/16 0030 05/15/16 0339 05/15/16 0851  TROPONINI 0.24* 0.27* 0.31*    Microbiology Results  @[email or web address] @  RADIOLOGY:  No results found.    Management plans discussed with the patient and He is in agreement. Stable for discharge home  Patient should follow up with cardiologist at [facility] in one week  CODE STATUS:     Code Status Orders        Start     Ordered   05/15/16 0530  Full code   Continuous     05/15/16 0529    Code Status History  Date Active Date Inactive Code Status Order ID Comments User Context   This patient has a current code status but no historical code status.      TOTAL TIME TAKING CARE OF THIS PATIENT: 36 minutes.    Note: This dictation was prepared with Dragon dictation along with smaller phrase technology. Any transcriptional errors that result from this process are unintentional.  Milynn Quirion M.D on 05/16/2016 at 12:00 PM  Between 7am to 6pm - Pager - [phone] After 6pm go to [email or web address] - password [email or web address] [place] [facility]  Office  [phone]  CC: Primary care physician; PROVIDER NOT IN SYSTEM

## 2016-05-16 NOTE — Progress Notes (Signed)
Patient Name: Javier Watson Date of Encounter: 05/16/2016  Hospital Problem List     Principal Problem:   SVT (supraventricular tachycardia) (HCC) Active Problems:   Elevated troponin   Acute kidney injury ([place])   Hypokalemia   Hypomagnesemia   Essential hypertension   Borderline diabetes   BPH (benign prostatic hyperplasia)   GERD (gastroesophageal reflux disease)   Obstructive sleep apnea    Subjective   No chest pain or sob.  No further SVT.  Eager to go home.  Inpatient Medications    . buPROPion  200 mg Oral BID  . citalopram  40 mg Oral Daily  . diltiazem  120 mg Oral BID  . docusate sodium  100 mg Oral BID  . heparin  5,000 Units Subcutaneous Q8H  . losartan  100 mg Oral Daily  . oxybutynin  5 mg Oral TID PC & HS  . pantoprazole  40 mg Oral BID  . potassium chloride SA  20 mEq Oral Daily  . prazosin  5 mg Oral BID  . sodium chloride flush  3 mL Intravenous Q12H    Vital Signs    Filed Vitals:   05/15/16 2032 05/15/16 2322 05/16/16 0319 05/16/16 0816  BP: 181/95 161/90 180/85 191/100  Pulse: 63 66 67 72  Temp: 98.6 F (37 C)  98.7 F (37.1 C) 98.2 F (36.8 C)  TempSrc: Oral  Oral Oral  Resp: 18  18 16   Height:      Weight:   244 lb 6.4 oz (110.859 kg) 244 lb 11.2 oz (110.995 kg)  SpO2: 99% 97% 95% 98%    Intake/Output Summary (Last 24 hours) at 05/16/16 1018 Last data filed at 05/16/16 0900  Gross per 24 hour  Intake 1654.78 ml  Output   1500 ml  Net 154.78 ml   Filed Weights   05/15/16 0004 05/16/16 0319 05/16/16 0816  Weight: 230 lb (104.327 kg) 244 lb 6.4 oz (110.859 kg) 244 lb 11.2 oz (110.995 kg)    Physical Exam    General: Pleasant, NAD. Neuro: Alert and oriented X 3. Moves all extremities spontaneously. Psych: Normal affect. HEENT:  Normal  Neck: Supple without bruits or JVD. Lungs:  Resp regular and unlabored, CTA. Heart: RRR no s3, s4, or murmurs. Abdomen: Soft, non-tender, non-distended, BS + x 4.  Extremities: No  clubbing, cyanosis or edema. DP/PT/Radials 2+ and equal bilaterally.  Labs    CBC  Recent Labs  05/15/16 0030  WBC 6.0  NEUTROABS 3.1  HGB 11.3*  HCT 32.3*  MCV 82.9  PLT 0000000   Basic Metabolic Panel  Recent Labs  05/15/16 0030 05/15/16 0851 05/15/16 1821 05/16/16 0409  NA 138  --   --  140  K 2.8* 3.2* 3.9 3.6  CL 105  --   --  109  CO2 24  --   --  25  GLUCOSE 161*  --   --  103*  BUN 25*  --   --  19  CREATININE 1.89*  --   --  1.56*  CALCIUM 8.7*  --   --  8.4*  MG  --  1.6*  --  2.0  PHOS  --  3.4  --   --    Liver Function Tests  Recent Labs  05/15/16 0030  AST 31  ALT 31  ALKPHOS 102  BILITOT <0.1*  PROT 6.4*  ALBUMIN 3.1*   Cardiac Enzymes  Recent Labs  05/15/16 0030 05/15/16 0339 05/15/16 [id number]  TROPONINI 0.24* 0.27* 0.31*   Hemoglobin A1C  Recent Labs  05/15/16 0030  HGBA1C 6.2*   Thyroid Function Tests  Recent Labs  05/15/16 1222  TSH 1.063    Telemetry    Sinus brady/sinus rhythm.  5 beats nsvt.  Radiology    No results found.  2D Echocardiogram 6.29.2017  Study Conclusions   - Procedure narrative: Transthoracic echocardiography. The study   was technically difficult. - Left ventricle: The cavity size was normal. There was mild   concentric hypertrophy. Systolic function was normal. The   estimated ejection fraction was in the range of 60% to 65%. Wall   motion was normal; there were no regional wall motion   abnormalities. Left ventricular diastolic function parameters   were normal. - Left atrium: The atrium was moderately dilated. - Right ventricle: Systolic function was normal. - Pulmonary arteries: Systolic pressure was within the normal   range.  Assessment & Plan    1.  SVT:  No recurrence.  Cont dilt - currently on 120 bid - could consolidate to 240 daily @ discharge.  Resting HR's 50's to low 60's - not clear he'd tolerate further titration of dilt.  Echo showed nl EF w/o significant valvular  abnormalities.  Pt plans to f/u @ [place] for further eval.  Would benefit from RFCA and he plans to discuss with his cardiologist there.  2.  Elevated troponin: In setting of SVT.  Peak of 0.31.  Echo showed nl EF w/o wall motion abnormalities.  Will need outpt ischemic testing.  Add ASA.  Will need f/u lipids with low threshold to add statin.  3.  Essential HTN:  BP's trending high  160-190.  Cont dilt (could consolidate to 240 daily).  Cont ARB as renal fxn is improving - suspect he has CKD.  Will likely need additional agent.  Says he was on chlorthalidone and amlodipine @ home.  Chlorthalidone not ideal in setting of AKI/CKD and hypoK/hypoMg on admission.  Amlodipine switched to Dilt 2/2 #1.  Would add hydralazine next.  4.  AKI:  Improved.  ? Chronicity.  On chronic ARB.  5.  Hypokalemia:  Improved  low normal @ 3.6.  Increase to 20 BID.  6.  Hypomagnesemia:  Mg normal @ 2.0 this AM.  7.  Borderline DM:  A1c mildly elevated @ 6.2.  Lifestyle modifications/wt loss advised.  He will f/u @ [place].  Signed, Murray Hodgkins NP

## 2016-05-16 NOTE — Progress Notes (Signed)
Pt discharged to home via wc.  Instructions given to pt, RX escribed(Asa, Diltiazem, Hydralazine, Losartan, Kdur).  Questions answered.  No distress.

## 2016-08-23 ENCOUNTER — Emergency Department: Payer: No Typology Code available for payment source

## 2016-08-23 ENCOUNTER — Emergency Department
Admission: EM | Admit: 2016-08-23 | Discharge: 2016-08-23 | Disposition: A | Discharge status: Home / Self Care | Payer: No Typology Code available for payment source | Attending: Emergency Medicine | Admitting: Emergency Medicine

## 2016-08-23 ENCOUNTER — Encounter: Payer: Self-pay | Admitting: Emergency Medicine

## 2016-08-23 DIAGNOSIS — Z79899 Other long term (current) drug therapy: Secondary | ICD-10-CM | POA: Diagnosis not present

## 2016-08-23 DIAGNOSIS — N453 Epididymo-orchitis: Secondary | ICD-10-CM | POA: Diagnosis not present

## 2016-08-23 DIAGNOSIS — Z7982 Long term (current) use of aspirin: Secondary | ICD-10-CM | POA: Insufficient documentation

## 2016-08-23 DIAGNOSIS — I10 Essential (primary) hypertension: Secondary | ICD-10-CM | POA: Diagnosis not present

## 2016-08-23 DIAGNOSIS — N5082 Scrotal pain: Secondary | ICD-10-CM | POA: Diagnosis present

## 2016-08-23 LAB — URINALYSIS COMPLETE WITH MICROSCOPIC (ARMC ONLY)
Bacteria, UA: NONE SEEN
Bilirubin Urine: NEGATIVE
Glucose, UA: NEGATIVE mg/dL
Hgb urine dipstick: NEGATIVE
Ketones, ur: NEGATIVE mg/dL
Leukocytes, UA: NEGATIVE
Nitrite: NEGATIVE
Protein, ur: 30 mg/dL — AB
Specific Gravity, Urine: 1.017 (ref 1.005–1.030)
Squamous Epithelial / LPF: NONE SEEN
pH: 6 (ref 5.0–8.0)

## 2016-08-23 MED ORDER — ONDANSETRON 4 MG PO TBDP: 4.0000 mg | ORAL_TABLET | Freq: Three times a day (TID) | ORAL | 0 refills | Status: DC | PRN

## 2016-08-23 MED ORDER — LEVOFLOXACIN 500 MG PO TABS: 500.0000 mg | ORAL_TABLET | Freq: Every day | ORAL | 0 refills | Status: AC

## 2016-08-23 NOTE — ED Notes (Signed)
Pt returned from ultrasound

## 2016-08-23 NOTE — ED Notes (Signed)
Patient transported to ultrasound.

## 2016-08-23 NOTE — ED Provider Notes (Signed)
[name] Emergency Department Provider Note  ____________________________________________  Time seen: Approximately 5:39 PM  I have reviewed the triage vital signs and the nursing notes.   HISTORY  Chief Complaint Groin Swelling    HPI Javier Watson is a 70 y.o. male who complains of scrotal swelling and pain, left greater than right, gradual onset and worsening for the past week. This started after he accidentally sat down on his testicles causing pain. Denies any dysuria frequency urgency hematuria ordered a full bowel movements. Denies diabetes. Denies risky sexual behavior     Past Medical History:  Diagnosis Date  . 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 Essential hypertension      Patient Active Problem List   Diagnosis Date Noted  . SVT (supraventricular tachycardia) ([place]) 05/16/2016  . Elevated troponin 05/16/2016  . Acute kidney injury ([place]) 05/16/2016  . Hyperglycemia 05/16/2016  . Borderline diabetes 05/16/2016  . BPH (benign prostatic hyperplasia) 05/16/2016  . GERD (gastroesophageal reflux disease) 05/16/2016  . Hypokalemia 05/16/2016  . Hypomagnesemia 05/16/2016  . Obstructive sleep apnea 05/16/2016  . Essential hypertension   . NSTEMI (non-ST elevated myocardial infarction) ([place]) 05/15/2016     Past Surgical History:  Procedure Laterality Date  . CRANIOTOMY    . ORTHOPEDIC SURGERY       Prior to Admission medications   Medication Sig Start Date End Date Taking? Authorizing Provider  aspirin 81 MG chewable tablet Chew 1 tablet (81 mg total) by mouth daily. 05/16/16   [name], NP  buPROPion [facility] SR) 100 MG 12 hr tablet Take 200 mg by mouth 2 (two) times daily. 02/08/16   Historical Provider, MD  citalopram (CELEXA) 40 MG tablet Take 40 mg by mouth daily. 02/22/16   Historical Provider, MD  cyclobenzaprine (FLEXERIL) 5 MG tablet Take 1 tablet by mouth 3 (three) times daily as needed. 04/16/16   Historical  Provider, MD  diltiazem (CARDIZEM CD) 120 MG 24 hr capsule Take 1 capsule (120 mg total) by mouth 2 (two) times daily. 05/16/16   [name], MD  hydrALAZINE (APRESOLINE) 10 MG tablet Take 1 tablet (10 mg total) by mouth 3 (three) times daily. 05/16/16   [name], MD  levofloxacin (LEVAQUIN) 500 MG tablet Take 1 tablet (500 mg total) by mouth daily. 08/23/16 09/02/16  [name], MD  losartan (COZAAR) 100 MG tablet Take 1 tablet (100 mg total) by mouth at bedtime. 05/17/16   [name], MD  MORPHINE SULFATE PO Take 1 tablet by mouth as needed.    Historical Provider, MD  ondansetron (ZOFRAN ODT) 4 MG disintegrating tablet Take 1 tablet (4 mg total) by mouth every 8 (eight) hours as needed for nausea or vomiting. 08/23/16   [name], MD  oxybutynin (DITROPAN) 5 MG tablet Take 5 mg by mouth 4 (four) times daily - after meals and at bedtime. 02/15/16   Historical Provider, MD  oxycodone (OXY-IR) 5 MG capsule Take 5 mg by mouth every 4 (four) hours as needed.    Historical Provider, MD  potassium chloride SA (K-DUR,KLOR-CON) 20 MEQ tablet Take 1 tablet (20 mEq total) by mouth daily. 05/16/16   [name], NP  prazosin (MINIPRESS) 5 MG capsule Take 1 capsule by mouth 2 (two) times daily. Reported on 05/15/2016 02/08/16   Historical Provider, MD     Allergies Review of patient's allergies indicates no known allergies.   Family History  Problem Relation Age of Onset  . Hypertension  Father     Social History Social History  Substance Use Topics  . Smoking status: Never Smoker  . Smokeless tobacco: Not on file  . Alcohol use Not on file    Review of Systems  Constitutional:   No fever or chills.  Cardiovascular:   No chest pain. Respiratory:   No dyspnea or cough. Gastrointestinal:   Negative for abdominal pain, vomiting and diarrhea.  Genitourinary:   Negative for dysuria or difficulty urinating. Musculoskeletal:   Negative for focal pain or swelling 10-point ROS  otherwise negative.  ____________________________________________   PHYSICAL EXAM:  VITAL SIGNS: ED Triage Vitals  Enc Vitals Group     BP 08/23/16 1532 (!) 183/72     Pulse Rate 08/23/16 1531 76     Resp 08/23/16 1531 18     Temp 08/23/16 1531 98.7 F (37.1 C)     Temp Source 08/23/16 1531 Oral     SpO2 08/23/16 1531 97 %     Weight 08/23/16 1531 230 lb (104.3 kg)     Height 08/23/16 1531 5\' 8"  (1.727 m)     Head Circumference --      Peak Flow --      Pain Score 08/23/16 1539 0     Pain Loc --      Pain Edu? --      Excl. in [place]? --     Vital signs reviewed, nursing assessments reviewed.   Constitutional:   Alert and oriented. Well appearing and in no distress. Eyes:   No scleral icterus. No conjunctival pallor. PERRL. EOMI.  No nystagmus. ENT   Head:   Normocephalic and atraumatic.   Nose:   No congestion/rhinnorhea. No septal hematoma   Mouth/Throat:   MMM, no pharyngeal erythema. No peritonsillar mass.    Neck:   No stridor. No SubQ emphysema. No meningismus. Hematological/Lymphatic/Immunilogical:   No cervical lymphadenopathy. Cardiovascular:   RRR. Symmetric bilateral radial and DP pulses.  No murmurs.  Respiratory:   Normal respiratory effort without tachypnea nor retractions. Breath sounds are clear and equal bilaterally. No wheezes/rales/rhonchi. Gastrointestinal:   Soft and nontender. Non distended. There is no CVA tenderness.  No rebound, rigidity, or guarding. Genitourinary:   No external lesions. Uncircumcised male, shaft and head appears unremarkable. No discharge. There is diffuse swelling of the scrotum and right testicle, mild tender on exam. No fluctuance or firm fluid collection to suggest abscess. Perineum is unremarkable. Musculoskeletal:   Nontender with normal range of motion in all extremities. No joint effusions.  No lower extremity tenderness.  No edema. Neurologic:   Normal speech and language.  CN 2-10 normal. Motor grossly  intact. No gross focal neurologic deficits are appreciated.  Skin:    Skin is warm, dry and intact. No rash noted.  No petechiae, purpura, or bullae.  ____________________________________________    LABS (pertinent positives/negatives) (all labs ordered are listed, but only abnormal results are displayed) Labs Reviewed  URINALYSIS COMPLETEWITH MICROSCOPIC ([place]) - Abnormal; Notable for the following:       Result Value   Color, Urine YELLOW (*)    APPearance CLEAR (*)    Protein, ur 30 (*)    All other components within normal limits   ____________________________________________   EKG    ____________________________________________    RADIOLOGY  Ultrasound scrotum 1. Increased vascularity within the bilateral testes, suggestive of  possible acute orchitis. Increased vascularity within the right  epididymis as well, suggestive of epididymal involvement on the  right.  2. Large bilateral hydroceles with debris, likely reactive.  3. No sonographic evidence for testicular torsion.     ____________________________________________   PROCEDURES Procedures  ____________________________________________   INITIAL IMPRESSION / ASSESSMENT AND PLAN / ED COURSE  Pertinent labs & imaging results that were available during my care of the patient were reviewed by me and considered in my medical decision making (see chart for details).  Patient well appearing no acute distress, presents with scrotal pain and swelling, possibly traumatic, all ultrasound finds evidence of orchitis. We'll start the patient on Levaquin and have him follow-up with primary care or urology. No evidence of torsion. History not consistent with torsion. Low suspicion for STI.     Clinical Course   ____________________________________________   FINAL CLINICAL IMPRESSION(S) / ED DIAGNOSES  Final diagnoses:  Epididymo-orchitis, acute       Portions of this note were generated with  dragon dictation software. Dictation errors may occur despite best attempts at proofreading.    [name], MD 08/23/16 [id number]

## 2016-08-23 NOTE — Discharge Instructions (Signed)
Your ultrasound today shows: 1. Increased vascularity within the bilateral testes, suggestive of  possible acute orchitis. Increased vascularity within the right  epididymis as well, suggestive of epididymal involvement on the  right.  2. Large bilateral hydroceles with debris, likely reactive.  3. No sonographic evidence for testicular torsion.    There is no urinary tract infection. Take Levofloxacin as prescribed and follow up with your doctor or urology in 1 week.

## 2016-08-23 NOTE — ED Triage Notes (Signed)
C/o right testicle swelling for 3-4 days. Tight from swelling but denies pain. Denies any color change. Denies fevers.

## 2016-08-28 ENCOUNTER — Ambulatory Visit (INDEPENDENT_AMBULATORY_CARE_PROVIDER_SITE_OTHER): Payer: 59 | Admitting: Urology

## 2016-08-28 ENCOUNTER — Encounter: Payer: Self-pay | Admitting: Urology

## 2016-08-28 VITALS — BP 155/89 | HR 87 | Ht 67.0 in | Wt 247.0 lb

## 2016-08-28 DIAGNOSIS — N5319 Other ejaculatory dysfunction: Secondary | ICD-10-CM | POA: Diagnosis not present

## 2016-08-28 DIAGNOSIS — N432 Other hydrocele: Secondary | ICD-10-CM | POA: Diagnosis not present

## 2016-08-28 DIAGNOSIS — N453 Epididymo-orchitis: Secondary | ICD-10-CM | POA: Diagnosis not present

## 2016-08-28 DIAGNOSIS — Z87442 Personal history of urinary calculi: Secondary | ICD-10-CM | POA: Diagnosis not present

## 2016-08-28 NOTE — Progress Notes (Signed)
08/28/2016 5:03 PM   Javier Watson Mar 22, 1946 [id number]  Referring provider: No referring provider defined for this encounter.  Chief Complaint  Patient presents with  . Groin Swelling    New Patient    HPI: 70 yo M who presents today for ED follow up for epidimo-orchitis.    He presented to the emergency room 1 week ago with progressive scrotal discomfort, R>L for 1 week. He reports that he did have some mild scrotal trauma including sitting on his testicles which he thinks may have caused the issue. Scrotal ultrasound revealed hyperemia with specifically increased vascularity of the right epididymis suggestive of possible right epididymitis. He also had large bilateral hydroceles with debris, right greater than left.  He was started on Levaquin. He states today that his pain is unchanged. Overall, it is more discomfort which is dull and achy and then pain itself. His right hemiscrotum has been swollen to approximately baseball size for several weeks and is unchanged.  He denies any voiding symptoms. No dysuria, gross hematuria, or any of her UTI symptoms. UA in the emergency room was negative. He denies any penile discharge or section transmitted infections.  Patient is previously followed at the [facility] for multiple urologic issues including history of kidney stones. He underwent ureteroscopy back in March 2017 at which time a stent was placed. From his history, it sounds like the stent was retained GU returned to the [place] in June 2017 with urosepsis. Since then, he has multiple complaints including ejaculatory dysfunction. He states that since surgery, his ejaculate fluid has been significantly lessened. He is concerned that he he had a vasectomy without [place] permission. He has been having unprotected intercourse since that time.  He does appear to have baseline CKD, most recent creatinine 1.56.   PMH: Past Medical History:  Diagnosis Date  . 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  Essential hypertension     Surgical History: Past Surgical History:  Procedure Laterality Date  . Arm Surgery    . COLONOSCOPY    . CRANIOTOMY    . CYSTOSCOPY WITH URETEROSCOPY AND STENT PLACEMENT Right 01/2016  . ORTHOPEDIC SURGERY    . TOTAL HIP ARTHROPLASTY Left 2006  . URETEROSCOPY Right 04/2016   stent removal- spesis    Home Medications:    Medication List       Accurate as of 08/28/16  5:03 PM. Always use your most recent med list.          aspirin 81 MG chewable tablet Chew 1 tablet (81 mg total) by mouth daily.   buPROPion 100 MG 12 hr tablet Commonly known as:  WELLBUTRIN SR Take 200 mg by mouth 2 (two) times daily.   citalopram 40 MG tablet Commonly known as:  CELEXA Take 40 mg by mouth daily.   cyclobenzaprine 5 MG tablet Commonly known as:  FLEXERIL Take 1 tablet by mouth 3 (three) times daily as needed.   diltiazem 120 MG 24 hr capsule Commonly known as:  CARDIZEM CD Take 1 capsule (120 mg total) by mouth 2 (two) times daily.   docusate sodium 100 MG capsule Commonly known as:  COLACE Take 100 mg by mouth 2 (two) times daily.   hydrALAZINE 10 MG tablet Commonly known as:  APRESOLINE Take 1 tablet (10 mg total) by mouth 3 (three) times daily.   levofloxacin 500 MG tablet Commonly known as:  LEVAQUIN Take 1 tablet (500 mg total) by mouth daily.   losartan  100 MG tablet Commonly known as:  COZAAR Take 1 tablet (100 mg total) by mouth at bedtime.   MORPHINE SULFATE PO Take 1 tablet by mouth as needed.   oxycodone 5 MG capsule Commonly known as:  OXY-IR Take 5 mg by mouth every 4 (four) hours as needed.   potassium chloride SA 20 MEQ tablet Commonly known as:  K-DUR,KLOR-CON Take 1 tablet (20 mEq total) by mouth daily.   prazosin 5 MG capsule Commonly known as:  MINIPRESS Take 1 capsule by mouth 2 (two) times daily. Reported on 05/15/2016       Allergies: No Known Allergies  Family History: Family History  Problem Relation Age  of Onset  . Hypertension Father   . Bladder Cancer Neg Hx   . Prostate cancer Neg Hx   . Kidney cancer Neg Hx     Social History:  reports that he has never smoked. He has never used smokeless tobacco. He reports that he does not drink alcohol or use drugs.  ROS: UROLOGY Frequent Urination?: No Hard to postpone urination?: No Burning/pain with urination?: No Get up at night to urinate?: No Leakage of urine?: No Urine stream starts and stops?: No Trouble starting stream?: No Do you have to strain to urinate?: No Blood in urine?: No Urinary tract infection?: No Sexually transmitted disease?: No Injury to kidneys or bladder?: No Painful intercourse?: No Weak stream?: No Erection problems?: No Penile pain?: No  Gastrointestinal Nausea?: No Vomiting?: No Indigestion/heartburn?: No Diarrhea?: No Constipation?: No  Constitutional Fever: No Night sweats?: No Weight loss?: No Fatigue?: No  Skin Skin rash/lesions?: No Itching?: No  Eyes Blurred vision?: No Double vision?: No  Ears/Nose/Throat Sore throat?: No Sinus problems?: No  Hematologic/Lymphatic Swollen glands?: Yes Easy bruising?: No  Cardiovascular Leg swelling?: No Chest pain?: No  Respiratory Cough?: No Shortness of breath?: No  Endocrine Excessive thirst?: No  Musculoskeletal Back pain?: No Joint pain?: No  Neurological Headaches?: No Dizziness?: No  Psychologic Depression?: No Anxiety?: No  Physical Exam: BP (!) 155/89   Pulse 87   Ht 5\' 7"  (1.702 m)   Wt 247 lb (112 kg)   BMI 38.69 kg/m   Constitutional:  Alert and oriented, No acute distress. HEENT:  AT, moist mucus membranes.  Trachea midline, no masses. Cardiovascular: No clubbing, cyanosis, or edema. Respiratory: Normal respiratory effort, no increased work of breathing. GI: Abdomen is soft, nontender, nondistended, no abdominal masses GU: Circumcised phallus with small drip of urine at tip. No urethral discharge.  Large right baseball sized hydrocele, nontender with no overlying skin changes. Small left hydrocele with testicle palpable with then, no obvious masses or tumors. Skin: No rashes, bruises or suspicious lesions. Lymph: No cervical or inguinal adenopathy. Neurologic: Grossly intact, no focal deficits, moving all 4 extremities. Psychiatric: Normal mood and affect.  Laboratory Data: Lab Results  Component Value Date   WBC 6.0 05/15/2016   HGB 11.3 (L) 05/15/2016   HCT 32.3 (L) 05/15/2016   MCV 82.9 05/15/2016   PLT 153 05/15/2016    Lab Results  Component Value Date   CREATININE 1.56 (H) 05/16/2016     Lab Results  Component Value Date   HGBA1C 6.2 (H) 05/15/2016    Urinalysis    Component Value Date/Time   COLORURINE YELLOW (A) 08/23/2016 1621   APPEARANCEUR CLEAR (A) 08/23/2016 1621   LABSPEC 1.017 08/23/2016 1621   PHURINE 6.0 08/23/2016 1621   GLUCOSEU NEGATIVE 08/23/2016 1621   HGBUR NEGATIVE 08/23/2016 1621  BILIRUBINUR NEGATIVE 08/23/2016 1621   KETONESUR NEGATIVE 08/23/2016 1621   PROTEINUR 30 (A) 08/23/2016 1621   NITRITE NEGATIVE 08/23/2016 1621   LEUKOCYTESUR NEGATIVE 08/23/2016 1621    Pertinent Imaging: CLINICAL DATA:  Initial evaluation for scrotal selling for 1 week. Pain.  EXAM: ULTRASOUND OF SCROTUM  TECHNIQUE: Complete ultrasound examination of the testicles, epididymis, and other scrotal structures was performed.  COMPARISON:  None available.  FINDINGS: Right testicle  Measurements: 4.2 x 2.9 x 3.9 cm. No mass or microlithiasis visualized. Increased vascularity present within the right testicle.  Left testicle  Measurements: 4.6 x 2.4 x 2.6 cm. No mass or microlithiasis visualized. Increased vascularity within the right testicle.  Right epididymis:  Normal in size with increased vascularity.  Left epididymis:  Normal in size and appearance.  Hydrocele: Large bilateral hydroceles with debris were the present, larger on  the right.  Varicocele:  Bilateral varicoceles were present.  Overlying skin of the scrotum appears somewhat edematous and thickened.  IMPRESSION: 1. Increased vascularity within the bilateral testes, suggestive of possible acute orchitis. Increased vascularity within the right epididymis as well, suggestive of epididymal involvement on the right. 2. Large bilateral hydroceles with debris, likely reactive. 3. No sonographic evidence for testicular torsion.   Electronically Signed   By: Jeannine Boga M.D.   On: 08/23/2016 17:26  Scrotal ultrasound was reviewed today personally.  Assessment & Plan:    1. Epididymoorchitis Right greater than left presumed epididymoorchitis, currently on Levaquin. Suspect his pain ongoing discomfort is primarily related to ongoing reactive hydroceles which are sizable, right greater than left.  Recommend completion of the antibiotic course and judicious use of NSAIDs as needed. Also recommend continued supportive care. He is currently wearing a jockstrap which I recommended continuation of.  Warning symptoms are reviewed today in detail.  2. Other hydrocele Reactive bilateral hydroceles, most significant on the right side.  I advised him that this is likely secondary to his epididymitis and may resolve spontaneously. Plan to reassess him in 4 weeks for reassessment. If he fails to resolve, briefly discussed possibility of hydrocelectomy. Questions were answered.  3. Other ejaculatory dysfunction Implants of decreased ejaculatory volume since surgery in June. Suspect this may be related to possibly his medication appraisals then and secondary to retrograde ejaculation.  He was also advised today that he is still fertile despite decreased ejaculatory volume and should use universal pregnancy precautions as desired.  4. History of kidney stones As above, currently asymptomatic.   If he elects to [facility] than [facility],  will likely obtain KUB in 6 months  Return in about 4 weeks (around 09/25/2016) for recheck scrotum.  [name], MD  [facility] [address], [address] [place],  [address] [phone]  I spent 45 min with this patient of which greater than 50% was spent in counseling and coordination of care with the patient.

## 2016-09-26 ENCOUNTER — Ambulatory Visit (INDEPENDENT_AMBULATORY_CARE_PROVIDER_SITE_OTHER): Payer: 59 | Admitting: Urology

## 2016-09-26 ENCOUNTER — Encounter: Payer: Self-pay | Admitting: Urology

## 2016-09-26 VITALS — BP 167/98 | HR 81 | Ht 67.0 in | Wt 249.8 lb

## 2016-09-26 DIAGNOSIS — N453 Epididymo-orchitis: Secondary | ICD-10-CM | POA: Diagnosis not present

## 2016-09-26 DIAGNOSIS — N432 Other hydrocele: Secondary | ICD-10-CM

## 2016-09-26 DIAGNOSIS — N5319 Other ejaculatory dysfunction: Secondary | ICD-10-CM | POA: Diagnosis not present

## 2016-09-26 DIAGNOSIS — Z87442 Personal history of urinary calculi: Secondary | ICD-10-CM

## 2016-09-26 NOTE — Progress Notes (Signed)
09/26/2016 12:12 PM   Javier Watson 1946/07/01 [id number]  Referring provider: No referring provider defined for this encounter.  Chief Complaint  Patient presents with  . Follow-up    HPI: 70 yo M who With a history of right epididymoorchitis and reactive hydrocele. He returns the office 4 weeks for recheck of his residual hydrocele.   He no longer has any right scrotal pain after completing antibiotics. He does have persistent grapefruit-sized right scrotal hemi-enlargement which is bothersome to him.  He denies any voiding symptoms.  Patient is previously followed at the [facility] for multiple urologic issues including history of kidney stones. He underwent ureteroscopy back in March 2017 at which time a stent was placed. From his history, it sounds like the stent was retained GU returned to the [place] in June 2017 with urosepsis. Since then, he has multiple complaints including ejaculatory dysfunction. He states that since surgery, his ejaculate fluid has been significantly lessened. He continues to be fixated on this today.  He does appear to have baseline CKD, most recent creatinine 1.56.  PMH: Past Medical History:  Diagnosis Date  . 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 Essential hypertension     Surgical History: Past Surgical History:  Procedure Laterality Date  . Arm Surgery    . COLONOSCOPY    . CRANIOTOMY    . CYSTOSCOPY WITH URETEROSCOPY AND STENT PLACEMENT Right 01/2016  . ORTHOPEDIC SURGERY    . TOTAL HIP ARTHROPLASTY Left 2006  . URETEROSCOPY Right 04/2016   stent removal- spesis    Home Medications:    Medication List       Accurate as of 09/26/16 12:12 PM. Always use your most recent med list.          aspirin 81 MG chewable tablet Chew 1 tablet (81 mg total) by mouth daily.   buPROPion 100 MG 12 hr tablet Commonly known as:  WELLBUTRIN SR Take 200 mg by mouth 2 (two) times daily.   citalopram 40 MG tablet Commonly known as:  CELEXA Take 40  mg by mouth daily.   cyclobenzaprine 5 MG tablet Commonly known as:  FLEXERIL Take 1 tablet by mouth 3 (three) times daily as needed.   diltiazem 120 MG 24 hr capsule Commonly known as:  CARDIZEM CD Take 1 capsule (120 mg total) by mouth 2 (two) times daily.   docusate sodium 100 MG capsule Commonly known as:  COLACE Take 100 mg by mouth 2 (two) times daily.   hydrALAZINE 10 MG tablet Commonly known as:  APRESOLINE Take 1 tablet (10 mg total) by mouth 3 (three) times daily.   losartan 100 MG tablet Commonly known as:  COZAAR Take 1 tablet (100 mg total) by mouth at bedtime.   MORPHINE SULFATE PO Take 1 tablet by mouth as needed.   oxycodone 5 MG capsule Commonly known as:  OXY-IR Take 5 mg by mouth every 4 (four) hours as needed.   potassium chloride SA 20 MEQ tablet Commonly known as:  K-DUR,KLOR-CON Take 1 tablet (20 mEq total) by mouth daily.   prazosin 5 MG capsule Commonly known as:  MINIPRESS Take 1 capsule by mouth 2 (two) times daily. Reported on 05/15/2016       Allergies: No Known Allergies  Family History: Family History  Problem Relation Age of Onset  . Hypertension Father   . Bladder Cancer Neg Hx   . Prostate cancer Neg Hx   . Kidney cancer Neg Hx  Social History:  reports that he has never smoked. He has never used smokeless tobacco. He reports that he does not drink alcohol or use drugs.  ROS: UROLOGY Frequent Urination?: No Hard to postpone urination?: No Burning/pain with urination?: No Get up at night to urinate?: No Leakage of urine?: No Urine stream starts and stops?: No Trouble starting stream?: No Do you have to strain to urinate?: No Blood in urine?: No Urinary tract infection?: No Sexually transmitted disease?: No Injury to kidneys or bladder?: No Painful intercourse?: No Weak stream?: No Erection problems?: No Penile pain?: No  Gastrointestinal Nausea?: No Vomiting?: No Indigestion/heartburn?: No Diarrhea?:  No Constipation?: No  Constitutional Fever: No Night sweats?: No Weight loss?: No Fatigue?: No  Skin Skin rash/lesions?: No Itching?: No  Eyes Blurred vision?: No Double vision?: No  Ears/Nose/Throat Sore throat?: No Sinus problems?: No  Hematologic/Lymphatic Swollen glands?: No Easy bruising?: No  Cardiovascular Leg swelling?: No Chest pain?: No  Respiratory Cough?: No Shortness of breath?: No  Endocrine Excessive thirst?: No  Musculoskeletal Back pain?: No Joint pain?: No  Neurological Headaches?: No Dizziness?: No  Psychologic Depression?: No Anxiety?: No  Physical Exam: BP (!) 167/98 (BP Location: Left Arm, Patient Position: Sitting, Cuff Size: Large)   Pulse 81   Ht 5\' 7"  (1.702 m)   Wt 249 lb 12.8 oz (113.3 kg)   BMI 39.12 kg/m   Constitutional:  Alert and oriented, No acute distress. HEENT:  AT, moist mucus membranes.  Trachea midline, no masses. Cardiovascular: No clubbing, cyanosis, or edema.  RRR.   Respiratory: Normal respiratory effort, no increased work of breathing. CTAB.   GI: Abdomen is soft, nontender, nondistended, no abdominal masses GU: Circumcised phallus.  No urethral discharge. Large softball sized hydrocele, nontender with no overlying skin changes. Left testicle palpable, no obvious masses or tumors. Skin: No rashes, bruises or suspicious lesions. Neurologic: Grossly intact, no focal deficits, moving all 4 extremities. Psychiatric: Normal mood and affect.  Laboratory Data: Lab Results  Component Value Date   WBC 6.0 05/15/2016   HGB 11.3 (L) 05/15/2016   HCT 32.3 (L) 05/15/2016   MCV 82.9 05/15/2016   PLT 153 05/15/2016    Lab Results  Component Value Date   CREATININE 1.56 (H) 05/16/2016    Lab Results  Component Value Date   HGBA1C 6.2 (H) 05/15/2016    Pertinent Imaging: Scrotal ultrasound reviewed again today  Scrotal ultrasound was reviewed today personally.  Assessment & Plan:    1.  Epididymoorchitis Resolved with abx clinically, no further pain  2. Right hydrocele Symptomatic right hydrocele, bothered by size of hemiscrotum. No improvement over the past 4 weeks with scrotal support.  At this point, I have recommended hydrocelectomy. Risks and benefits of the surgery were reviewed in detail. This includes risk of infection, hematoma, residual swelling, and failure of the procedure. We discussed the recurrence rate after this procedure.    He is on aspirin and has a history of cardiovascular disease. He will need cardiac clearance to hold this.  3. Other ejaculatory dysfunction Continues to be fixated on ejaculatory volume Etiology of this is unclear but I do not feel that is related to his previous ureteroscopy procedure  4. History of kidney stones As above, currently asymptomatic.   If [facility]s [facility], will likely obtain KUB in 6 months  Return for schedule surgery.  [name], MD  [facility] [address], [place], [place]  [address] [phone]  I spent 25 min with this patient of which greater than 50% was spent in counseling and coordination of care with the patient.

## 2016-10-13 ENCOUNTER — Inpatient Hospital Stay: Admission: RE | Admit: 2016-10-13 | Payer: No Typology Code available for payment source | Source: Ambulatory Visit

## 2016-10-13 ENCOUNTER — Telehealth: Payer: Self-pay | Admitting: Radiology

## 2016-10-13 NOTE — Telephone Encounter (Signed)
LMOM. Need to notify pt of surgery information. 

## 2016-10-14 ENCOUNTER — Inpatient Hospital Stay: Admission: RE | Admit: 2016-10-14 | Payer: No Typology Code available for payment source | Source: Ambulatory Visit

## 2016-10-14 ENCOUNTER — Other Ambulatory Visit: Payer: Self-pay | Admitting: Radiology

## 2016-10-14 DIAGNOSIS — N433 Hydrocele, unspecified: Secondary | ICD-10-CM

## 2016-10-14 NOTE — Telephone Encounter (Signed)
LM with wife that pt has pre-admit testing appt 10/14/16 @10 :15 & requested pt return call. Wife voices understanding.

## 2016-10-14 NOTE — Telephone Encounter (Signed)
Notified pt of surgery scheduled 10/21/16, pre-admit testing appt on 10/16/16 @12 :00 per pt request, & to call day prior to surgery for arrival time to SDS. Pt voices understanding.

## 2016-10-16 DIAGNOSIS — Z01812 Encounter for preprocedural laboratory examination: Secondary | ICD-10-CM | POA: Diagnosis present

## 2016-10-16 DIAGNOSIS — Z01810 Encounter for preprocedural cardiovascular examination: Secondary | ICD-10-CM | POA: Diagnosis present

## 2016-10-16 DIAGNOSIS — E119 Type 2 diabetes mellitus without complications: Secondary | ICD-10-CM | POA: Diagnosis not present

## 2016-10-16 DIAGNOSIS — I10 Essential (primary) hypertension: Secondary | ICD-10-CM | POA: Diagnosis not present

## 2016-10-16 LAB — DIFFERENTIAL
Basophils Absolute: 0 10*3/uL (ref 0–0.1)
Basophils Relative: 1 %
Eosinophils Absolute: 0 10*3/uL (ref 0–0.7)
Eosinophils Relative: 1 %
Lymphocytes Relative: 20 %
Lymphs Abs: 1.3 10*3/uL (ref 1.0–3.6)
Monocytes Absolute: 0.6 10*3/uL (ref 0.2–1.0)
Monocytes Relative: 9 %
Neutro Abs: 4.5 10*3/uL (ref 1.4–6.5)
Neutrophils Relative %: 69 %

## 2016-10-16 LAB — CBC
HCT: 42.7 % (ref 40.0–52.0)
Hemoglobin: 14 g/dL (ref 13.0–18.0)
MCH: 27.7 pg (ref 26.0–34.0)
MCHC: 32.8 g/dL (ref 32.0–36.0)
MCV: 84.4 fL (ref 80.0–100.0)
Platelets: 182 10*3/uL (ref 150–440)
RBC: 5.06 MIL/uL (ref 4.40–5.90)
RDW: 18.1 % — ABNORMAL HIGH (ref 11.5–14.5)
WBC: 6.5 10*3/uL (ref 3.8–10.6)

## 2016-10-16 LAB — POTASSIUM: Potassium: 3.1 mmol/L — ABNORMAL LOW (ref 3.5–5.1)

## 2016-10-16 NOTE — Patient Instructions (Signed)
  Your procedure is scheduled on: October 21, 2016 (Tuesday) Report to Same Day Surgery 2nd floor medical mall [facility] Entrance-take elevator on left to 2nd floor.  Check in with surgery information desk.) To find out your arrival time please call [phone] between 1PM - 3PM on October 20, 2016 (Monday)  Remember: Instructions that are not followed completely may result in serious medical risk, up to and including death, or upon the discretion of your surgeon and anesthesiologist your surgery may need to be rescheduled.    _x___ 1. Do not eat food or drink liquids after midnight. No gum chewing or hard candies.     __x__ 2. No Alcohol for 24 hours before or after surgery.   __x__3. No Smoking for 24 prior to surgery.   ____  4. Bring all medications with you on the day of surgery if instructed.    __x__ 5. Notify your doctor if there is any change in your medical condition     (cold, fever, infections).     Do not wear jewelry, make-up, hairpins, clips or nail polish.  Do not wear lotions, powders, or perfumes. You may wear deodorant.  Do not shave 48 hours prior to surgery. Men may shave face and neck.  Do not bring valuables to the hospital.    [facility] is not responsible for any belongings or valuables.               Contacts, dentures or bridgework may not be worn into surgery.  Leave your suitcase in the car. After surgery it may be brought to your room.  For patients admitted to the hospital, discharge time is determined by your treatment team.   Patients discharged the day of surgery will not be allowed to drive home.  You will need someone to drive you home and stay with you the night of your procedure.    Please read over the following fact sheets that you were given:   [facility] Preparing for Surgery and or MRSA Information   _x___ Take these medicines the morning of surgery with A SIP OF WATER:    1. Diltiazem  2. HydrAlazine  3. Minipress  4.  Citalopram  5.  6.  ____Fleets enema or Magnesium Citrate as directed.     __ Use CHG Soap or sage wipes as directed on instruction sheet   ____ Use inhalers on the day of surgery and bring to hospital day of surgery  ____ Stop metformin 2 days prior to surgery    ____ Take 1/2 of usual insulin dose the night before surgery and none on the morning of           surgery.   __x__ Stop Aspirin, Coumadin, Pllavix ,Eliquis, Effient, or Pradaxa (Patient instructed by Dr. Erlene Quan office to stop Aspirin on November 30)  x__ Stop Anti-inflammatories such as Advil, Aleve, Ibuprofen, Motrin, Naproxen,          Naprosyn, Goodies powders or aspirin products. Ok to take Tylenol.   _x___ Stop supplements until after surgery.  (Stop Fish Oil and Men Multivitamin now)  _x__ Bring C-Pap to the hospital.

## 2016-10-16 NOTE — Pre-Procedure Instructions (Signed)
Potassium results faxed to Dr. Erlene Quan office.

## 2016-10-17 ENCOUNTER — Encounter
Admission: RE | Admit: 2016-10-17 | Discharge: 2016-10-17 | Disposition: A | Discharge status: Home / Self Care | Payer: No Typology Code available for payment source | Source: Ambulatory Visit | Attending: Urology | Admitting: Urology

## 2016-10-17 ENCOUNTER — Encounter: Payer: Self-pay | Admitting: *Deleted

## 2016-10-17 DIAGNOSIS — I10 Essential (primary) hypertension: Secondary | ICD-10-CM | POA: Insufficient documentation

## 2016-10-17 DIAGNOSIS — Z01810 Encounter for preprocedural cardiovascular examination: Secondary | ICD-10-CM | POA: Diagnosis not present

## 2016-10-17 DIAGNOSIS — Z01812 Encounter for preprocedural laboratory examination: Secondary | ICD-10-CM | POA: Insufficient documentation

## 2016-10-17 DIAGNOSIS — E119 Type 2 diabetes mellitus without complications: Secondary | ICD-10-CM | POA: Insufficient documentation

## 2016-10-17 HISTORY — DX: Anxiety disorder, unspecified: F41.9

## 2016-10-17 HISTORY — DX: Depression, unspecified: F32.A

## 2016-10-17 HISTORY — DX: Unspecified osteoarthritis, unspecified site: M19.90

## 2016-10-17 HISTORY — DX: Sleep apnea, unspecified: G47.30

## 2016-10-17 HISTORY — DX: Post-traumatic stress disorder, unspecified: F43.10

## 2016-10-17 HISTORY — DX: Major depressive disorder, single episode, unspecified: F32.9

## 2016-10-17 LAB — BASIC METABOLIC PANEL
Anion gap: 9 (ref 5–15)
BUN: 26 mg/dL — ABNORMAL HIGH (ref 6–20)
CO2: 27 mmol/L (ref 22–32)
Calcium: 9.3 mg/dL (ref 8.9–10.3)
Chloride: 102 mmol/L (ref 101–111)
Creatinine, Ser: 1.76 mg/dL — ABNORMAL HIGH (ref 0.61–1.24)
GFR calc Af Amer: 44 mL/min — ABNORMAL LOW (ref >60)
GFR calc non Af Amer: 38 mL/min — ABNORMAL LOW (ref >60)
Glucose, Bld: 97 mg/dL (ref 65–99)
Potassium: 3.1 mmol/L — ABNORMAL LOW (ref 3.5–5.1)
Sodium: 138 mmol/L (ref 135–145)

## 2016-10-17 NOTE — Pre-Procedure Instructions (Addendum)
LM FOR PATIENT TO COME FOR EKG / METB TODAY OR 10/20/16. DID NOT [facility]CE[facility]TH[facility] 05/16/16 UNDER CHART REVIEW CV PROC. WITH EF 60 65%

## 2016-10-20 NOTE — Telephone Encounter (Signed)
LMOM. Need to notify pt to increase potassium supplement to 82mEq prior to surgery.

## 2016-10-21 ENCOUNTER — Ambulatory Visit: Payer: 59 | Admitting: Anesthesiology

## 2016-10-21 ENCOUNTER — Ambulatory Visit
Admission: RE | Admit: 2016-10-21 | Discharge: 2016-10-21 | Disposition: A | Discharge status: Home / Self Care | Payer: 59 | Source: Ambulatory Visit | Attending: Urology | Admitting: Urology

## 2016-10-21 ENCOUNTER — Encounter: Payer: Self-pay | Admitting: *Deleted

## 2016-10-21 ENCOUNTER — Encounter
Admission: RE | Disposition: A | Discharge status: Home / Self Care | Payer: Self-pay | Source: Ambulatory Visit | Attending: Urology

## 2016-10-21 DIAGNOSIS — Z87442 Personal history of urinary calculi: Secondary | ICD-10-CM | POA: Insufficient documentation

## 2016-10-21 DIAGNOSIS — G4730 Sleep apnea, unspecified: Secondary | ICD-10-CM | POA: Insufficient documentation

## 2016-10-21 DIAGNOSIS — I2510 Atherosclerotic heart disease of native coronary artery without angina pectoris: Secondary | ICD-10-CM | POA: Insufficient documentation

## 2016-10-21 DIAGNOSIS — I129 Hypertensive chronic kidney disease with stage 1 through stage 4 chronic kidney disease, or unspecified chronic kidney disease: Secondary | ICD-10-CM | POA: Insufficient documentation

## 2016-10-21 DIAGNOSIS — Z96642 Presence of left artificial hip joint: Secondary | ICD-10-CM | POA: Insufficient documentation

## 2016-10-21 DIAGNOSIS — Z7982 Long term (current) use of aspirin: Secondary | ICD-10-CM | POA: Insufficient documentation

## 2016-10-21 DIAGNOSIS — N433 Hydrocele, unspecified: Secondary | ICD-10-CM | POA: Insufficient documentation

## 2016-10-21 DIAGNOSIS — N189 Chronic kidney disease, unspecified: Secondary | ICD-10-CM | POA: Diagnosis not present

## 2016-10-21 DIAGNOSIS — R7303 Prediabetes: Secondary | ICD-10-CM | POA: Diagnosis not present

## 2016-10-21 DIAGNOSIS — K219 Gastro-esophageal reflux disease without esophagitis: Secondary | ICD-10-CM | POA: Insufficient documentation

## 2016-10-21 DIAGNOSIS — F329 Major depressive disorder, single episode, unspecified: Secondary | ICD-10-CM | POA: Diagnosis not present

## 2016-10-21 DIAGNOSIS — F419 Anxiety disorder, unspecified: Secondary | ICD-10-CM | POA: Insufficient documentation

## 2016-10-21 HISTORY — DX: Chronic kidney disease, unspecified: N18.9

## 2016-10-21 HISTORY — DX: Malignant (primary) neoplasm, unspecified: C80.1

## 2016-10-21 HISTORY — DX: Other specified congenital malformations of skin: Q82.8

## 2016-10-21 HISTORY — DX: Type 2 diabetes mellitus without complications: E11.9

## 2016-10-21 HISTORY — PX: HYDROCELE EXCISION: SHX482

## 2016-10-21 LAB — POCT I-STAT 4, (NA,K, GLUC, HGB,HCT)
Glucose, Bld: 101 mg/dL — ABNORMAL HIGH (ref 65–99)
HCT: 41 % (ref 39.0–52.0)
Hemoglobin: 13.9 g/dL (ref 13.0–17.0)
Potassium: 3.3 mmol/L — ABNORMAL LOW (ref 3.5–5.1)
Sodium: 141 mmol/L (ref 135–145)

## 2016-10-21 LAB — GLUCOSE, CAPILLARY: Glucose-Capillary: 97 mg/dL (ref 65–99)

## 2016-10-21 SURGERY — HYDROCELECTOMY
Anesthesia: General | Laterality: Right | Wound class: Clean Contaminated

## 2016-10-21 MED ORDER — ONDANSETRON HCL 4 MG/2ML IJ SOLN
INTRAMUSCULAR | Status: DC | PRN
  Administered 2016-10-21: 4 mg via INTRAVENOUS

## 2016-10-21 MED ORDER — GLYCOPYRROLATE 0.2 MG/ML IJ SOLN
INTRAMUSCULAR | Status: DC | PRN
  Administered 2016-10-21: 0.2 mg via INTRAVENOUS

## 2016-10-21 MED ORDER — FENTANYL CITRATE (PF) 100 MCG/2ML IJ SOLN
INTRAMUSCULAR | Status: DC | PRN
  Administered 2016-10-21 (×3): 50 ug via INTRAVENOUS

## 2016-10-21 MED ORDER — CEFAZOLIN SODIUM-DEXTROSE 2-4 GM/100ML-% IV SOLN
INTRAVENOUS | Status: AC
  Administered 2016-10-21: 2 g via INTRAVENOUS
  Filled 2016-10-21: qty 100

## 2016-10-21 MED ORDER — CEFAZOLIN SODIUM-DEXTROSE 2-4 GM/100ML-% IV SOLN
2.0000 g | INTRAVENOUS | Status: AC
  Administered 2016-10-21: 2 g via INTRAVENOUS

## 2016-10-21 MED ORDER — FAMOTIDINE 20 MG PO TABS
ORAL_TABLET | ORAL | Status: AC
  Administered 2016-10-21: 20 mg via ORAL
  Filled 2016-10-21: qty 1

## 2016-10-21 MED ORDER — LACTATED RINGERS IV SOLN: INTRAVENOUS | Status: DC

## 2016-10-21 MED ORDER — OXYCODONE HCL 5 MG PO CAPS
5.0000 mg | ORAL_CAPSULE | ORAL | 0 refills | Status: DC | PRN
Start: 2016-10-21 — End: 2020-05-22

## 2016-10-21 MED ORDER — LIDOCAINE HCL (CARDIAC) 20 MG/ML IV SOLN
INTRAVENOUS | Status: DC | PRN
  Administered 2016-10-21: 100 mg via INTRAVENOUS

## 2016-10-21 MED ORDER — FAMOTIDINE 20 MG PO TABS
20.0000 mg | ORAL_TABLET | Freq: Once | ORAL | Status: AC
  Administered 2016-10-21: 20 mg via ORAL

## 2016-10-21 MED ORDER — EPHEDRINE SULFATE 50 MG/ML IJ SOLN
INTRAMUSCULAR | Status: DC | PRN
  Administered 2016-10-21 (×2): 5 mg via INTRAVENOUS

## 2016-10-21 MED ORDER — ONDANSETRON HCL 4 MG/2ML IJ SOLN: 4.0000 mg | Freq: Once | INTRAMUSCULAR | Status: DC | PRN

## 2016-10-21 MED ORDER — BUPIVACAINE HCL 0.5 % IJ SOLN
INTRAMUSCULAR | Status: DC | PRN
  Administered 2016-10-21: 10 mL

## 2016-10-21 MED ORDER — BUPIVACAINE HCL (PF) 0.5 % IJ SOLN
INTRAMUSCULAR | Status: AC
  Filled 2016-10-21: qty 30

## 2016-10-21 MED ORDER — DOCUSATE SODIUM 100 MG PO CAPS: 100.0000 mg | ORAL_CAPSULE | Freq: Two times a day (BID) | ORAL | 0 refills | Status: DC

## 2016-10-21 MED ORDER — SODIUM CHLORIDE 0.9 % IV SOLN
INTRAVENOUS | Status: DC
  Administered 2016-10-21: 100 mL/h via INTRAVENOUS

## 2016-10-21 MED ORDER — PROPOFOL 10 MG/ML IV BOLUS
INTRAVENOUS | Status: DC | PRN
  Administered 2016-10-21: 200 mg via INTRAVENOUS

## 2016-10-21 MED ORDER — FENTANYL CITRATE (PF) 100 MCG/2ML IJ SOLN: 25.0000 ug | INTRAMUSCULAR | Status: DC | PRN

## 2016-10-21 SURGICAL SUPPLY — 34 items
BLADE SURG 15 STRL LF DISP TIS (BLADE) IMPLANT
BLADE SURG 15 STRL SS (BLADE) ×2
CANISTER SUCT 1200ML W/VALVE (MISCELLANEOUS) ×2 IMPLANT
CHLORAPREP W/TINT 26ML (MISCELLANEOUS) ×2 IMPLANT
DRAIN PENROSE 1/4X12 LTX (DRAIN) ×1 IMPLANT
DRAPE LAPAROTOMY 77X122 PED (DRAPES) ×2 IMPLANT
ELECT REM PT RETURN 9FT ADLT (ELECTROSURGICAL) ×2
ELECTRODE REM PT RTRN 9FT ADLT (ELECTROSURGICAL) ×1 IMPLANT
GAUZE FLUFF 18X24 1PLY STRL (GAUZE/BANDAGES/DRESSINGS) ×2 IMPLANT
GAUZE SPONGE 4X4 12PLY STRL (GAUZE/BANDAGES/DRESSINGS) ×1 IMPLANT
GLOVE BIO SURGEON STRL SZ 6.5 (GLOVE) ×4 IMPLANT
GLOVE BIO SURGEON STRL SZ7 (GLOVE) ×2 IMPLANT
GOWN STRL REUS W/ TWL LRG LVL3 (GOWN DISPOSABLE) ×2 IMPLANT
GOWN STRL REUS W/TWL LRG LVL3 (GOWN DISPOSABLE) ×4
KIT RM TURNOVER STRD PROC AR (KITS) ×2 IMPLANT
LABEL OR SOLS (LABEL) ×1 IMPLANT
LIQUID BAND (GAUZE/BANDAGES/DRESSINGS) ×2 IMPLANT
NDL HYPO 25X1 1.5 SAFETY (NEEDLE) ×1 IMPLANT
NEEDLE HYPO 25X1 1.5 SAFETY (NEEDLE) ×2 IMPLANT
NS IRRIG 500ML POUR BTL (IV SOLUTION) ×2 IMPLANT
PACK BASIN MINOR ARMC (MISCELLANEOUS) ×2 IMPLANT
SUPPORETR ATHLETIC LG (MISCELLANEOUS) ×1 IMPLANT
SUPPORTER ATHLETIC LG (MISCELLANEOUS)
SUPPORTER ATHLETIC XL (MISCELLANEOUS) ×2
SUPPORTER ATHLETIC XL 3X44-50X (MISCELLANEOUS) IMPLANT
SUT CHROMIC 3 0 SH 27 (SUTURE) ×3 IMPLANT
SUT ETHILON 3-0 FS-10 30 BLK (SUTURE) ×2
SUT ETHILON NAB PS2 4-0 18IN (SUTURE) ×1 IMPLANT
SUT VIC AB 3-0 SH 27 (SUTURE) ×2
SUT VIC AB 3-0 SH 27X BRD (SUTURE) ×2 IMPLANT
SUT VIC AB 4-0 SH 27 (SUTURE) ×2
SUT VIC AB 4-0 SH 27XANBCTRL (SUTURE) ×1 IMPLANT
SUTURE EHLN 3-0 FS-10 30 BLK (SUTURE) IMPLANT
SYRINGE 10CC LL (SYRINGE) ×2 IMPLANT

## 2016-10-21 NOTE — Interval H&P Note (Signed)
History and Physical Interval Note:  10/21/2016 7:30 AM  Javier Watson  has presented today for surgery, with the diagnosis of right hydrocele  The various methods of treatment have been discussed with the patient and family. After consideration of risks, benefits and other options for treatment, the patient has consented to  Procedure(s): HYDROCELECTOMY ADULT (Right) as a surgical intervention .  The patient's history has been reviewed, patient examined, no change in status, stable for surgery.  I have reviewed the patient's chart and labs.  Questions were answered to the patient's satisfaction.     [name]

## 2016-10-21 NOTE — Anesthesia Postprocedure Evaluation (Signed)
Anesthesia Post Note  Patient: Javier Watson  Procedure(s) Performed: Procedure(s) (LRB): HYDROCELECTOMY ADULT (Right)  Patient location during evaluation: PACU Anesthesia Type: General Level of consciousness: awake and alert Pain management: pain level controlled Vital Signs Assessment: post-procedure vital signs reviewed and stable Respiratory status: spontaneous breathing and respiratory function stable Cardiovascular status: stable Anesthetic complications: no    Last Vitals:  Vitals:   10/21/16 0643 10/21/16 0857  BP: (!) 158/88 140/88  Pulse: 84 63  Resp: 16 14  Temp: 36.8 C 36.7 C    Last Pain:  Vitals:   10/21/16 0857  TempSrc:   PainSc: Asleep                 KEPHART,WILLIAM K

## 2016-10-21 NOTE — Transfer of Care (Signed)
Immediate Anesthesia Transfer of Care Note  Patient: Javier Watson  Procedure(s) Performed: Procedure(s): HYDROCELECTOMY ADULT (Right)  Patient Location: PACU  Anesthesia Type:General  Level of Consciousness: sedated  Airway & Oxygen Therapy: Patient Spontanous Breathing and Patient connected to face mask oxygen  Post-op Assessment: Report given to RN and Post -op Vital signs reviewed and stable  Post vital signs: Reviewed and stable  Last Vitals:  Vitals:   10/21/16 0643 10/21/16 0857  BP: (!) 158/88 140/88  Pulse: 84 63  Resp: 16 14  Temp: 36.8 C     Last Pain:  Vitals:   10/21/16 0643  TempSrc: Oral  PainSc: 2       Patients Stated Pain Goal: 1 ([id number])  Complications: No apparent anesthesia complications

## 2016-10-21 NOTE — Op Note (Signed)
Date of procedure: 10/21/16   Preoperative diagnosis:  1. Right hydrocele   Postoperative diagnosis:  1. Right hydrocele   Procedure: 1. Right hydrocelectomy  Surgeon: [name], MD  Anesthesia: General  Complications: None  Intraoperative findings: Thickened right hydrocele sac with bloody fluid, 300 cc  EBL: Minimal  Specimens: Hydrocele sac  Drains: Penrose drain  Indication: Javier Watson is a 70 y.o. patient with right reactive hydrocele status post adequate treatment for epididymoorchitis. His right hydrocele fail to resolve. He remained symptomatic.Marland Kitchen  After reviewing the management options for treatment, he elected to proceed with the above surgical procedure(s). We have discussed the potential benefits and risks of the procedure, side effects of the proposed treatment, the likelihood of the patient achieving the goals of the procedure, and any potential problems that might occur during the procedure or recuperation. Informed consent has been obtained.  Description of procedure:  The patient was taken to the operating room and general anesthesia was induced.  The patient was placed in the supine position, prepped and draped in the usual sterile fashion, and preoperative antibiotics were administered. A preoperative time-out was performed.   An approximate 8 cm transverse incision was made across the right hemiscrotum overlying the hydrocele along Langerhan's lines. Prior to this, 10 cc of Marcaine was instilled for local anesthetic. The incision was carried down through the subcutaneous tissues including dartos. The hydrocele sac was then encountered and noted to have a very thickened rind with overlying adherent tissue. Dissection was somewhat difficult and performed by 8 of Bovie electrocautery along with blunt dissection. Eventually, the right testicle was able to be delivered through the incision. The thickened rind was dissected away. The hydrocele sac was then  incised using a knife and 300 cc of dark brown bloody fluid was evacuated. Within the base of the hydrocele, there was significant hemosiderin deposits and evidence of inflammation. The testicle itself appeared normal. The epididymis was somewhat thickened. The edges of the hydrocele sac were then excised carefully. Of note, the hydrocele sac was blind ending. The edges of the sac were then carefully fulgurated using Bovie electrocautery and then oversewn using a 3-0 Vicryl suture. The right testicle was then placed back within the anatomic position after careful hemostasis of the scrotal bed was achieved. A drain in the form of a penrose drain was placed in the dependent portion of the scrotum and secured in place with a drain stitch.  The scrotum was then closed in 2 layers using a 3-0 Vicryl to close the dartos layer and 3-0 chromic interrupted sutures to close the scrotal skin. The patient was then cleaned and dried. A dressing of Dermabond was applied. Scrotal fluffs and a scrotal support was applied. He was awakened from general anesthesia and taken to PACU in stable condition.  Plan: Patient will follow up on Friday for drain removal.  [name], M.D.

## 2016-10-21 NOTE — Discharge Instructions (Signed)
Hydrocelectomy, Adult, Care After This sheet gives you information about how to care for yourself after your procedure. Your health care provider may also give you more specific instructions. If you have problems or questions, contact your health care provider. What can I expect after the procedure? After your procedure, it is common to have mild discomfort, swelling, and bruising in the pouch that holds your testicles (scrotum). Follow these instructions at home: Bathing  Ask your health care provider when you can shower, take baths, or go swimming.  If you were told to wear an athletic support strap, take it off when you shower or take a bath. Incision care  Follow instructions from your health care provider about how to take care of your incision. Make sure you:  Wash your hands with soap and water before you change your bandage (dressing). If soap and water are not available, use hand sanitizer.  Change your dressing as told by your health care provider.  Leave stitches (sutures) in place.  Check your incision and scrotum every day for signs of infection. Check for:  More redness, swelling, or pain.  Blood or fluid.  Warmth.  Pus or a bad smell. Managing pain, stiffness, and swelling  If directed, apply ice to the injured area:  Put ice in a plastic bag.  Place a towel between your skin and the bag.  Leave the ice on for 20 minutes, 2-3 times per day. Driving  Do not drive for 24 hours if you were given a sedative.  Do not drive or use heavy machinery while taking prescription pain medicine.  Ask your health care provider when it is safe to drive. Activity  Do not do any activities that require great strength and energy (are vigorous) for as long as told by your health care provider.  Return to your normal activities as told by your health care provider. Ask your health care provider what activities are safe for you.  Do not lift anything that is heavier than 10  lb (4.5 kg) until your health care provider says that it is safe. General instructions  Take over-the-counter and prescription medicines only as told by your health care provider.  Keep all follow-up visits as told by your health care provider. This is important.  If you were given an athletic support strap, wear it as told by your health care provider.  If you had a drain put in during the procedure, you will need to return for a follow-up visit to have it removed. Contact a health care provider if:  Your pain is not controlled with medicine.  You have more redness or swelling around your scrotum.  You have blood or fluid coming from your scrotum.  Your incision feels warm to the touch.  You have pus or a bad smell coming from your scrotum.  You have a fever.    Surgical [facility] Introduction Surgical drains are used to remove extra fluid that normally builds up in a surgical wound after surgery. A surgical drain helps to heal a surgical wound. Different kinds of surgical drains include:  Active drains. These drains use suction to pull drainage away from the surgical wound. Drainage flows through a tube to a container outside of the body. It is important to keep the bulb or the drainage container flat (compressed) at all times, except while you empty it. Flattening the bulb or container creates suction. The two most common types of active drains are bulb drains and Hemovac  drains.  Passive drains. These drains allow fluid to drain naturally, by gravity. Drainage flows through a tube to a bandage (dressing) or a container outside of the body. Passive drains do not need to be emptied. The most common type of passive drain is the Penrose drain. A drain is placed during surgery. Immediately after surgery, drainage is usually bright red and a little thicker than water. The drainage may gradually turn yellow or pink and become thinner. It is likely that your health care provider  will remove the drain when the drainage stops or when the amount decreases to 1-2 Tbsp (15-30 mL) during a 24-hour period. How to care for your surgical drain  Keep the skin around the drain dry and covered with a dressing at all times.  Check your drain area every day for signs of infection. Check for:  More redness, swelling, or pain.  Pus or a bad smell.  Cloudy drainage. Follow instructions from your health care provider about how to take care of your drain and how to change your dressing. Change your dressing at least one time every day. Change it more often if needed to keep the dressing dry. Make sure you: 1. Gather your supplies, including:  Tape.  Germ-free cleaning solution (sterile saline).  Split gauze drain sponge: 4 x 4 inches (10 x 10 cm).  Gauze square: 4 x 4 inches (10 x 10 cm). 2. Wash your hands with soap and water before you change your dressing. If soap and water are not available, use hand sanitizer. 3. Remove the old dressing. Avoid using scissors to do that. 4. Use sterile saline to clean your skin around the drain. 5. Place the tube through the slit in a drain sponge. Place the drain sponge so that it covers your wound. 6. Place the gauze square or another drain sponge on top of the drain sponge that is on the wound. Make sure the tube is between those layers. 7. Tape the dressing to your skin. 8. If you have an active bulb or Hemovac drain, tape the drainage tube to your skin 1-2 inches (2.5-5 cm) below the place where the tube enters your body. Taping keeps the tube from pulling on any stitches (sutures) that you have. 9. Wash your hands with soap and water.  AMBULATORY SURGERY  DISCHARGE INSTRUCTIONS   1) The drugs that you were given will stay in your system until tomorrow so for the next 24 hours you should not:  A) Drive an automobile B) Make any legal decisions C) Drink any alcoholic beverage   2) You may resume regular meals tomorrow.  Today  it is better to start with liquids and gradually work up to solid foods.  You may eat anything you prefer, but it is better to start with liquids, then soup and crackers, and gradually work up to solid foods.   3) Please notify your doctor immediately if you have any unusual bleeding, trouble breathing, redness and pain at the surgery site, drainage, fever, or pain not relieved by medication.    4) Additional Instructions:        Please contact your physician with any problems or Same Day Surgery at [phone], Monday through Friday 6 am to 4 pm, or [place] at [facility] number at [phone].

## 2016-10-21 NOTE — Anesthesia Procedure Notes (Signed)
Procedure Name: LMA Insertion Performed by: Craigory Toste Pre-anesthesia Checklist: Patient identified, Patient being monitored, Timeout performed, Emergency Drugs available and Suction available Patient Re-evaluated:Patient Re-evaluated prior to inductionOxygen Delivery Method: Circle system utilized Preoxygenation: Pre-oxygenation with 100% oxygen Intubation Type: IV induction LMA: LMA inserted LMA Size: 5.0 Tube type: Oral Number of attempts: 1 Placement Confirmation: positive ETCO2 and breath sounds checked- equal and bilateral Tube secured with: Tape Dental Injury: Teeth and Oropharynx as per pre-operative assessment        

## 2016-10-21 NOTE — H&P (View-Only) (Signed)
09/26/2016 12:12 PM   Javier Watson 12/30/1945 [id number]  Referring provider: No referring provider defined for this encounter.  Chief Complaint  Patient presents with  . Follow-up    HPI: 70 yo M who With a history of right epididymoorchitis and reactive hydrocele. He returns the office 4 weeks for recheck of his residual hydrocele.   He no longer has any right scrotal pain after completing antibiotics. He does have persistent grapefruit-sized right scrotal hemi-enlargement which is bothersome to him.  He denies any voiding symptoms.  Patient is previously followed at the [facility] for multiple urologic issues including history of kidney stones. He underwent ureteroscopy back in March 2017 at which time a stent was placed. From his history, it sounds like the stent was retained GU returned to the [place] in June 2017 with urosepsis. Since then, he has multiple complaints including ejaculatory dysfunction. He states that since surgery, his ejaculate fluid has been significantly lessened. He continues to be fixated on this today.  He does appear to have baseline CKD, most recent creatinine 1.56.  PMH: Past Medical History:  Diagnosis Date  . 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 Essential hypertension     Surgical History: Past Surgical History:  Procedure Laterality Date  . Arm Surgery    . COLONOSCOPY    . CRANIOTOMY    . CYSTOSCOPY WITH URETEROSCOPY AND STENT PLACEMENT Right 01/2016  . ORTHOPEDIC SURGERY    . TOTAL HIP ARTHROPLASTY Left 2006  . URETEROSCOPY Right 04/2016   stent removal- spesis    Home Medications:    Medication List       Accurate as of 09/26/16 12:12 PM. Always use your most recent med list.          aspirin 81 MG chewable tablet Chew 1 tablet (81 mg total) by mouth daily.   buPROPion 100 MG 12 hr tablet Commonly known as:  WELLBUTRIN SR Take 200 mg by mouth 2 (two) times daily.   citalopram 40 MG tablet Commonly known as:  CELEXA Take 40  mg by mouth daily.   cyclobenzaprine 5 MG tablet Commonly known as:  FLEXERIL Take 1 tablet by mouth 3 (three) times daily as needed.   diltiazem 120 MG 24 hr capsule Commonly known as:  CARDIZEM CD Take 1 capsule (120 mg total) by mouth 2 (two) times daily.   docusate sodium 100 MG capsule Commonly known as:  COLACE Take 100 mg by mouth 2 (two) times daily.   hydrALAZINE 10 MG tablet Commonly known as:  APRESOLINE Take 1 tablet (10 mg total) by mouth 3 (three) times daily.   losartan 100 MG tablet Commonly known as:  COZAAR Take 1 tablet (100 mg total) by mouth at bedtime.   MORPHINE SULFATE PO Take 1 tablet by mouth as needed.   oxycodone 5 MG capsule Commonly known as:  OXY-IR Take 5 mg by mouth every 4 (four) hours as needed.   potassium chloride SA 20 MEQ tablet Commonly known as:  K-DUR,KLOR-CON Take 1 tablet (20 mEq total) by mouth daily.   prazosin 5 MG capsule Commonly known as:  MINIPRESS Take 1 capsule by mouth 2 (two) times daily. Reported on 05/15/2016       Allergies: No Known Allergies  Family History: Family History  Problem Relation Age of Onset  . Hypertension Father   . Bladder Cancer Neg Hx   . Prostate cancer Neg Hx   . Kidney cancer Neg Hx  Social History:  reports that he has never smoked. He has never used smokeless tobacco. He reports that he does not drink alcohol or use drugs.  ROS: UROLOGY Frequent Urination?: No Hard to postpone urination?: No Burning/pain with urination?: No Get up at night to urinate?: No Leakage of urine?: No Urine stream starts and stops?: No Trouble starting stream?: No Do you have to strain to urinate?: No Blood in urine?: No Urinary tract infection?: No Sexually transmitted disease?: No Injury to kidneys or bladder?: No Painful intercourse?: No Weak stream?: No Erection problems?: No Penile pain?: No  Gastrointestinal Nausea?: No Vomiting?: No Indigestion/heartburn?: No Diarrhea?:  No Constipation?: No  Constitutional Fever: No Night sweats?: No Weight loss?: No Fatigue?: No  Skin Skin rash/lesions?: No Itching?: No  Eyes Blurred vision?: No Double vision?: No  Ears/Nose/Throat Sore throat?: No Sinus problems?: No  Hematologic/Lymphatic Swollen glands?: No Easy bruising?: No  Cardiovascular Leg swelling?: No Chest pain?: No  Respiratory Cough?: No Shortness of breath?: No  Endocrine Excessive thirst?: No  Musculoskeletal Back pain?: No Joint pain?: No  Neurological Headaches?: No Dizziness?: No  Psychologic Depression?: No Anxiety?: No  Physical Exam: BP (!) 167/98 (BP Location: Left Arm, Patient Position: Sitting, Cuff Size: Large)   Pulse 81   Ht 5\' 7"  (1.702 m)   Wt 249 lb 12.8 oz (113.3 kg)   BMI 39.12 kg/m   Constitutional:  Alert and oriented, No acute distress. HEENT:  AT, moist mucus membranes.  Trachea midline, no masses. Cardiovascular: No clubbing, cyanosis, or edema.  RRR.   Respiratory: Normal respiratory effort, no increased work of breathing. CTAB.   GI: Abdomen is soft, nontender, nondistended, no abdominal masses GU: Circumcised phallus.  No urethral discharge. Large softball sized hydrocele, nontender with no overlying skin changes. Left testicle palpable, no obvious masses or tumors. Skin: No rashes, bruises or suspicious lesions. Neurologic: Grossly intact, no focal deficits, moving all 4 extremities. Psychiatric: Normal mood and affect.  Laboratory Data: Lab Results  Component Value Date   WBC 6.0 05/15/2016   HGB 11.3 (L) 05/15/2016   HCT 32.3 (L) 05/15/2016   MCV 82.9 05/15/2016   PLT 153 05/15/2016    Lab Results  Component Value Date   CREATININE 1.56 (H) 05/16/2016    Lab Results  Component Value Date   HGBA1C 6.2 (H) 05/15/2016    Pertinent Imaging: Scrotal ultrasound reviewed again today  Scrotal ultrasound was reviewed today personally.  Assessment & Plan:    1.  Epididymoorchitis Resolved with abx clinically, no further pain  2. Right hydrocele Symptomatic right hydrocele, bothered by size of hemiscrotum. No improvement over the past 4 weeks with scrotal support.  At this point, I have recommended hydrocelectomy. Risks and benefits of the surgery were reviewed in detail. This includes risk of infection, hematoma, residual swelling, and failure of the procedure. We discussed the recurrence rate after this procedure.    He is on aspirin and has a history of cardiovascular disease. He will need cardiac clearance to hold this.  3. Other ejaculatory dysfunction Continues to be fixated on ejaculatory volume Etiology of this is unclear but I do not feel that is related to his previous ureteroscopy procedure  4. History of kidney stones As above, currently asymptomatic.   If [facility] elects [facility], will likely obtain KUB in 6 months  Return for schedule surgery.  [name], MD  [facility] [address], [place], [place]  [address] [phone]  I spent 25 min with this patient of which greater than 50% was spent in counseling and coordination of care with the patient.

## 2016-10-21 NOTE — Anesthesia Preprocedure Evaluation (Signed)
Anesthesia Evaluation  Patient identified by MRN, date of birth, ID band Patient awake    Reviewed: Allergy & Precautions, NPO status , Patient's Chart, lab work & pertinent test results  History of Anesthesia Complications Negative for: history of anesthetic complications  Airway Mallampati: II       Dental   Pulmonary neg pulmonary ROS, sleep apnea and Continuous Positive Airway Pressure Ventilation ,           Cardiovascular hypertension, Pt. on medications Past MI: pt denies.       Neuro/Psych Anxiety Depression negative neurological ROS     GI/Hepatic GERD  Medicated and Controlled,  Endo/Other  diabetes (borderline)  Renal/GU Renal Insufficiency     Musculoskeletal   Abdominal   Peds  Hematology   Anesthesia Other Findings   Reproductive/Obstetrics                             Anesthesia Physical Anesthesia Plan  ASA: III  Anesthesia Plan: General   Post-op Pain Management:    Induction: Intravenous  Airway Management Planned: LMA  Additional Equipment:   Intra-op Plan:   Post-operative Plan:   Informed Consent: I have reviewed the patients History and Physical, chart, labs and discussed the procedure including the risks, benefits and alternatives for the proposed anesthesia with the patient or authorized representative who has indicated his/her understanding and acceptance.     Plan Discussed with:   Anesthesia Plan Comments:         Anesthesia Quick Evaluation

## 2016-10-22 LAB — SURGICAL PATHOLOGY

## 2016-10-24 ENCOUNTER — Ambulatory Visit (INDEPENDENT_AMBULATORY_CARE_PROVIDER_SITE_OTHER): Payer: 59 | Admitting: Urology

## 2016-10-24 VITALS — BP 187/80 | HR 75 | Ht 67.0 in | Wt 236.0 lb

## 2016-10-24 DIAGNOSIS — N5319 Other ejaculatory dysfunction: Secondary | ICD-10-CM

## 2016-10-24 DIAGNOSIS — N432 Other hydrocele: Secondary | ICD-10-CM

## 2016-10-25 NOTE — Progress Notes (Signed)
   10/25/16  CC:  Chief Complaint  Patient presents with  . Routine Post Op    drain removal    HPI:  70 year old male with a history of right epididymoorchitis and reactive hydrocele which failed to resolve. He is ultimately taken to the OR earlier this week for right hydrocelectomy. That time, he had bloody colored fluid drained from around his right testicle with excision of hydrocele sac. A Penrose drain was left in place. He returns to the office today for removal of this. He had a good amount of drainage the first day or 2 but this is since load. He's been wearing his truck. His pain is tolerable. No redness, fevers, or purulence.  Blood pressure (!) 187/80, pulse 75, height 5\' 7"  (1.702 m), weight 236 lb (107 kg). NED. A&Ox3.   No respiratory distress   Abd soft, NT, ND, obese Scrotum mildly edematous without erythema. Right hemiscrotum with small hematoma, otherwise unremarkable. Incision intact. Dependent Penrose drain appreciated.  Suture anchoring Penrose drain was cut and a Penrose was removed today without difficulty.  Assessment/ Plan:  1. Other hydrocele  s/p R hydrocelectomy Drain removed today Precaucations reviewed  2. Other ejaculatory dysfunction Continues to perseverate about low ejaculatory volumes and blood-tinged ejaculate. Will address this again at next follow-up follow-up  Return in about 6 weeks (around 12/05/2016) for wound check.   [name], MD

## 2016-10-27 ENCOUNTER — Telehealth: Payer: Self-pay

## 2016-10-27 NOTE — Telephone Encounter (Signed)
Pt called inquiring about when he could shower again. Made pt aware per Larene Beach pt can shower now just dont scrub the incision site. Pt voiced understanding.

## 2016-12-02 ENCOUNTER — Ambulatory Visit (INDEPENDENT_AMBULATORY_CARE_PROVIDER_SITE_OTHER): Payer: Managed Care, Other (non HMO) | Admitting: Urology

## 2016-12-02 ENCOUNTER — Encounter: Payer: Self-pay | Admitting: Urology

## 2016-12-02 VITALS — BP 202/102 | HR 70 | Ht 67.0 in | Wt 251.0 lb

## 2016-12-02 DIAGNOSIS — N432 Other hydrocele: Secondary | ICD-10-CM

## 2016-12-02 DIAGNOSIS — Z87442 Personal history of urinary calculi: Secondary | ICD-10-CM | POA: Diagnosis not present

## 2016-12-02 DIAGNOSIS — N5319 Other ejaculatory dysfunction: Secondary | ICD-10-CM | POA: Diagnosis not present

## 2016-12-02 NOTE — Progress Notes (Signed)
12/02/2016 5:00 PM   Javier Watson [birth year] [id number]  Referring provider: No referring provider defined for this encounter.  No chief complaint on file.   HPI: 71 yo M with a history of right epididymoorchitis and reactive hydrocele s/p hydrocelectomy on 10/21/16. He returns the office 4 weeks for wound recheck.    Improving edema and scrotal size.  Minimal pain.  He denies any voiding symptoms.  Patient is previously followed at the [facility] for multiple urologic issues including history of kidney stones. He underwent ureteroscopy back in March 2017 at which time a stent was placed. From his history, it sounds like the stent was retained GU returned to the [place] in June 2017 with urosepsis. Since then, he has multiple complaints including ejaculatory dysfunction. He states that since surgery, his ejaculate fluid has been significantly lessened and blooding.  He continues to be fixated on his.    Due for lasix renogram at [facility] on 01/2017.  He does appear to have baseline CKD, most recent creatinine 1.56.  PMH: Past Medical History:  Diagnosis Date  . Anxiety   . Arthritis   . Cancer ([place])    PROSTATE  . Chronic kidney disease    STAGE 3  . 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 Depression   . Diabetes mellitus without complication ([place])    patient follows diet only. no medications  . Essential hypertension   . Keratoderma   . Post traumatic stress disorder (PTSD)   . Sleep apnea    C-PAP    Surgical History: Past Surgical History:  Procedure Laterality Date  . Arm Surgery    . BACK SURGERY  1967   Laminectomy on skull of neck  . COLONOSCOPY    . CRANIOTOMY  1967  . CYSTOSCOPY WITH URETEROSCOPY AND STENT PLACEMENT Right 01/2016  . HYDROCELE EXCISION Right 10/21/2016   Procedure: HYDROCELECTOMY ADULT;  Surgeon: [name], MD;  Location: [facility] ORS;  Service: Urology;  Laterality: Right;  . ORTHOPEDIC SURGERY    . TOTAL HIP ARTHROPLASTY Left 2006  . URETEROSCOPY  Right 04/2016   stent removal- spesis    Home Medications:  Allergies as of 12/02/2016   No Known Allergies     Medication List       Accurate as of 12/02/16  5:00 PM. Always use your most recent med list.          aspirin 81 MG chewable tablet Chew 1 tablet (81 mg total) by mouth daily.   buPROPion 100 MG 12 hr tablet Commonly known as:  WELLBUTRIN SR Take 200 mg by mouth 2 (two) times daily.   citalopram 40 MG tablet Commonly known as:  CELEXA Take 40 mg by mouth daily.   diltiazem 120 MG 24 hr capsule Commonly known as:  CARDIZEM CD Take 1 capsule (120 mg total) by mouth 2 (two) times daily.   diltiazem 360 MG 24 hr capsule Commonly known as:  TIAZAC Take 360 mg by mouth daily.   docusate sodium 100 MG capsule Commonly known as:  COLACE Take 1 capsule (100 mg total) by mouth 2 (two) times daily.   Fish Oil 1000 MG Caps Take 1,000 mg by mouth daily.   hydrALAZINE 10 MG tablet Commonly known as:  APRESOLINE Take 1 tablet (10 mg total) by mouth 3 (three) times daily.   losartan 100 MG tablet Commonly known as:  COZAAR Take 1 tablet (100 mg total) by mouth at bedtime.   morphine  15 MG tablet Commonly known as:  MSIR Take 15 mg by mouth 2 (two) times daily as needed for severe pain.   multivitamin with minerals Tabs tablet Take 1 tablet by mouth daily.   oxycodone 5 MG capsule Commonly known as:  OXY-IR Take 1 capsule (5 mg total) by mouth every 4 (four) hours as needed for pain.   potassium chloride SA 20 MEQ tablet Commonly known as:  K-DUR,KLOR-CON Take 1 tablet (20 mEq total) by mouth daily.   prazosin 5 MG capsule Commonly known as:  MINIPRESS Take 1 capsule by mouth 2 (two) times daily. Reported on 05/15/2016       Allergies: No Known Allergies  Family History: Family History  Problem Relation Age of Onset  . Hypertension Father   . Bladder Cancer Neg Hx   . Prostate cancer Neg Hx   . Kidney cancer Neg Hx     Social History:   reports that he has never smoked. He has never used smokeless tobacco. He reports that he does not drink alcohol or use drugs.  ROS: UROLOGY Frequent Urination?: No Hard to postpone urination?: No Burning/pain with urination?: No Get up at night to urinate?: No Leakage of urine?: No Urine stream starts and stops?: No Trouble starting stream?: No Do you have to strain to urinate?: No Blood in urine?: No Urinary tract infection?: No Sexually transmitted disease?: No Injury to kidneys or bladder?: No Painful intercourse?: No Weak stream?: No Erection problems?: No Penile pain?: No  Gastrointestinal Nausea?: No Vomiting?: No Indigestion/heartburn?: No Diarrhea?: No Constipation?: No  Constitutional Fever: No Night sweats?: No Weight loss?: No Fatigue?: No  Skin Skin rash/lesions?: No Itching?: No  Eyes Blurred vision?: No Double vision?: No  Ears/Nose/Throat Sore throat?: No Sinus problems?: No  Hematologic/Lymphatic Swollen glands?: No Easy bruising?: No  Cardiovascular Leg swelling?: No Chest pain?: No  Respiratory Cough?: No Shortness of breath?: No  Endocrine Excessive thirst?: No  Musculoskeletal Back pain?: No Joint pain?: No  Neurological Headaches?: No Dizziness?: No  Psychologic Depression?: No Anxiety?: No  Physical Exam: BP (!) 202/102   Pulse 70   Ht 5\' 7"  (1.702 m)   Wt 251 lb (113.9 kg)   BMI 39.31 kg/m   Constitutional:  Alert and oriented, No acute distress. HEENT: [place] AT, moist mucus membranes.  Trachea midline, no masses. Cardiovascular: No clubbing, cyanosis, or edema.   Respiratory: Normal respiratory effort, no increased work of breathing.  GI: Abdomen is soft, nontender, nondistended, no abdominal masses GU: Circumcised phallus. Right hemiscrotum mildly enlarged, apple sized hematoma.  No skin changes.  Wound well healed.  Left testicle normal.   Skin: No rashes, bruises or suspicious lesions. Neurologic: Grossly  intact, no focal deficits, moving all 4 extremities. Psychiatric: Normal mood and affect.  Laboratory Data: Lab Results  Component Value Date   WBC 6.5 10/16/2016   HGB 13.9 10/21/2016   HCT 41.0 10/21/2016   MCV 84.4 10/16/2016   PLT 182 10/16/2016    Lab Results  Component Value Date   CREATININE 1.76 (H) 10/17/2016    Lab Results  Component Value Date   HGBA1C 6.2 (H) 05/15/2016    Pertinent Imaging: n/a  Scrotal ultrasound was reviewed today personally.  Assessment & Plan:    1. Right hydrocele S/p hydrocelectomy Healing well, no concerns  2. Other ejaculatory dysfunction Continues to be fixated on ejaculatory volume Etiology of this is unclear but I do not feel that is related to his previous ureteroscopy  procedure/ medication Blood in ejaculate likely related to previous epdidmyoorchitis/ UTI  3. History of kidney stones As above, currently asymptomatic.   Has f/u at [place] on 01/2017, wants to follow up there for lasix renogram Will defer management to [facility] for stone care/ kidney care  Return in about 6 months (around 06/01/2017) for KUB.  [name], MD  [facility] [address] [place], [place] [address]   [phone]

## 2016-12-19 ENCOUNTER — Ambulatory Visit: Payer: 59 | Admitting: Urology

## 2017-06-03 ENCOUNTER — Ambulatory Visit (INDEPENDENT_AMBULATORY_CARE_PROVIDER_SITE_OTHER): Payer: Managed Care, Other (non HMO) | Admitting: Urology

## 2017-06-03 DIAGNOSIS — N200 Calculus of kidney: Secondary | ICD-10-CM

## 2017-06-07 NOTE — Progress Notes (Signed)
error 

## 2017-06-16 ENCOUNTER — Ambulatory Visit
Admission: RE | Admit: 2017-06-16 | Discharge: 2017-06-16 | Disposition: A | Discharge status: Home / Self Care | Payer: 59 | Source: Ambulatory Visit | Attending: Urology | Admitting: Urology

## 2017-06-16 DIAGNOSIS — N200 Calculus of kidney: Secondary | ICD-10-CM

## 2017-06-16 DIAGNOSIS — I878 Other specified disorders of veins: Secondary | ICD-10-CM | POA: Insufficient documentation

## 2017-06-16 DIAGNOSIS — Z96642 Presence of left artificial hip joint: Secondary | ICD-10-CM | POA: Insufficient documentation

## 2017-06-17 ENCOUNTER — Ambulatory Visit (INDEPENDENT_AMBULATORY_CARE_PROVIDER_SITE_OTHER): Payer: Managed Care, Other (non HMO) | Admitting: Urology

## 2017-06-17 ENCOUNTER — Encounter: Payer: Self-pay | Admitting: Urology

## 2017-06-17 VITALS — BP 166/75 | HR 79 | Ht 67.0 in | Wt 240.0 lb

## 2017-06-17 DIAGNOSIS — Z87442 Personal history of urinary calculi: Secondary | ICD-10-CM | POA: Diagnosis not present

## 2017-06-17 DIAGNOSIS — N5319 Other ejaculatory dysfunction: Secondary | ICD-10-CM | POA: Diagnosis not present

## 2017-06-17 DIAGNOSIS — R972 Elevated prostate specific antigen [PSA]: Secondary | ICD-10-CM | POA: Diagnosis not present

## 2017-06-17 NOTE — Progress Notes (Signed)
06/17/2017 2:36 PM   Javier Watson 07/22/1946 [id number]  Referring provider: No referring provider defined for this encounter.  Chief Complaint  Patient presents with  . Nephrolithiasis    76month w/KUB    HPI: 71 yo M with a history of right epididymoorchitis and hydrocele status post hydrocelectomy as well as recurrent nephrolithiasis.  He underwent right hydrocelectomy on 20/2542 without complication.  Pathology was benign.  His right hemiscrotum is now back to baseline he is pleased with the result.  Patient is previously followed at the [facility] for multiple urologic issues including history of kidney stones. He underwent ureteroscopy back in March 2017 at which time a stent was placed. From his history, it sounds like the stent was retained GU returned to the [place] in June 2017 with urosepsis.   No recent episodes of flank pain, gross hematuria, or any other stone episodes.  Since then, he has multiple complaints including ejaculatory dysfunction. He states that since surgery, his ejaculate fluid has been significantly lessened and blooding.  He continues to be fixated on his which is unchanged from previous visits.   He also underwent prostate biopsy in 01/2017 which he was told was negative.  His PSA/rectal exam is followed at the [place].  He does appear to have baseline CKD, most recent creatinine 1.56.   PMH: Past Medical History:  Diagnosis Date  . Anxiety   . Arthritis   . Cancer ([place])    PROSTATE  . Chronic kidney disease    STAGE 3  . 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 Depression   . Diabetes mellitus without complication ([place])    patient follows diet only. no medications  . Essential hypertension   . Keratoderma   . Post traumatic stress disorder (PTSD)   . Sleep apnea    C-PAP    Surgical History: Past Surgical History:  Procedure Laterality Date  . Arm Surgery    . BACK SURGERY  1967   Laminectomy on skull of neck  . COLONOSCOPY    . CRANIOTOMY  1967    . CYSTOSCOPY WITH URETEROSCOPY AND STENT PLACEMENT Right 01/2016  . HYDROCELE EXCISION Right 10/21/2016   Procedure: HYDROCELECTOMY ADULT;  Surgeon: [name], MD;  Location: [facility] ORS;  Service: Urology;  Laterality: Right;  . ORTHOPEDIC SURGERY    . TOTAL HIP ARTHROPLASTY Left 2006  . URETEROSCOPY Right 04/2016   stent removal- spesis    Home Medications:  Allergies as of 06/17/2017   No Known Allergies     Medication List       Accurate as of 06/17/17 11:59 PM. Always use your most recent med list.          aspirin 81 MG chewable tablet Chew 1 tablet (81 mg total) by mouth daily.   buPROPion 100 MG 12 hr tablet Commonly known as:  WELLBUTRIN SR Take 200 mg by mouth 2 (two) times daily.   citalopram 40 MG tablet Commonly known as:  CELEXA Take 40 mg by mouth daily.   diltiazem 360 MG 24 hr capsule Commonly known as:  TIAZAC Take 360 mg by mouth daily.   docusate sodium 100 MG capsule Commonly known as:  COLACE Take 1 capsule (100 mg total) by mouth 2 (two) times daily.   Fish Oil 1000 MG Caps Take 1,000 mg by mouth daily.   hydrALAZINE 10 MG tablet Commonly known as:  APRESOLINE Take 1 tablet (10 mg total) by mouth 3 (three) times  daily.   losartan 100 MG tablet Commonly known as:  COZAAR Take 1 tablet (100 mg total) by mouth at bedtime.   morphine 15 MG tablet Commonly known as:  MSIR Take 15 mg by mouth 2 (two) times daily as needed for severe pain.   multivitamin with minerals Tabs tablet Take 1 tablet by mouth daily.   oxycodone 5 MG capsule Commonly known as:  OXY-IR Take 1 capsule (5 mg total) by mouth every 4 (four) hours as needed for pain.   potassium chloride SA 20 MEQ tablet Commonly known as:  K-DUR,KLOR-CON Take 1 tablet (20 mEq total) by mouth daily.   prazosin 5 MG capsule Commonly known as:  MINIPRESS Take 1 capsule by mouth 2 (two) times daily. Reported on 05/15/2016       Allergies: No Known Allergies  Family  History: Family History  Problem Relation Age of Onset  . Hypertension Father   . Bladder Cancer Neg Hx   . Prostate cancer Neg Hx   . Kidney cancer Neg Hx     Social History:  reports that he has never smoked. He has never used smokeless tobacco. He reports that he does not drink alcohol or use drugs.  ROS: UROLOGY Frequent Urination?: No Hard to postpone urination?: No Burning/pain with urination?: No Get up at night to urinate?: No Leakage of urine?: No Urine stream starts and stops?: No Trouble starting stream?: No Do you have to strain to urinate?: No Blood in urine?: No Urinary tract infection?: No Sexually transmitted disease?: No Injury to kidneys or bladder?: No Painful intercourse?: No Weak stream?: No Erection problems?: No Penile pain?: No  Gastrointestinal Nausea?: No Vomiting?: No Indigestion/heartburn?: No Diarrhea?: No Constipation?: No  Constitutional Fever: No Night sweats?: No Weight loss?: No Fatigue?: No  Skin Skin rash/lesions?: No Itching?: No  Eyes Blurred vision?: No Double vision?: No  Ears/Nose/Throat Sore throat?: No Sinus problems?: No  Hematologic/Lymphatic Swollen glands?: No Easy bruising?: No  Cardiovascular Leg swelling?: No Chest pain?: No  Respiratory Cough?: No Shortness of breath?: No  Endocrine Excessive thirst?: No  Musculoskeletal Back pain?: No Joint pain?: No  Neurological Headaches?: No Dizziness?: No  Psychologic Depression?: No Anxiety?: No  Physical Exam: BP (!) 166/75   Pulse 79   Ht 5\' 7"  (1.702 m)   Wt 240 lb (108.9 kg)   BMI 37.59 kg/m   Constitutional:  Alert and oriented, No acute distress. HEENT: [place] AT, moist mucus membranes.  Trachea midline, no masses. Cardiovascular: No clubbing, cyanosis, or edema.   Respiratory: Normal respiratory effort, no increased work of breathing.  GI: Abdomen is soft, nontender, nondistended, no abdominal masses GU: Circumcised phallus.   Normal scrotum without enlargement bilaterally.  Well-healed.  Normal testicles bilaterally. Skin: No rashes, bruises or suspicious lesions. Neurologic: Grossly intact, no focal deficits, moving all 4 extremities. Psychiatric: Normal mood and affect.  Laboratory Data: Lab Results  Component Value Date   WBC 6.5 10/16/2016   HGB 13.9 10/21/2016   HCT 41.0 10/21/2016   MCV 84.4 10/16/2016   PLT 182 10/16/2016    Lab Results  Component Value Date   CREATININE 1.76 (H) 10/17/2016    Lab Results  Component Value Date   HGBA1C 6.2 (H) 05/15/2016    Pertinent Imaging: CLINICAL DATA:  Nephrolithiasis  EXAM: ABDOMEN - 1 VIEW  COMPARISON:  None.  FINDINGS: There are phleboliths in the right pelvis. No other abnormal calcifications are noted. There are multiple metallic foreign bodies in the  lower left pelvis and proximal thigh regions. There is a total hip replacement on the left.  There is moderate stool in the colon. There is no bowel dilatation or air-fluid level to suggest bowel obstruction. No evident free air.  IMPRESSION: Phleboliths lower right pelvis. No other abnormal calcifications. No bowel obstruction or free air evident. Total-hip replacement on left.   Electronically Signed   By: Lowella Grip III M.D.   On: 06/16/2017 16:26  KUB reviewed today  Assessment & Plan:    1. Other ejaculatory dysfunction Continues to be fixated on ejaculatory quantity and consistency Do not suspect any pathology, patient reassured again today  2. History of kidney stones KUB today without evidence of stones Reviewed stone diet and food intake recommendations for stone formers  3. History of elevated PSA Follow at [facility]  This point in time, his acute issue with epididymoorchitis/hydrocele been addressed and resolved.  His chronic issues including history of recurrent nephrolithiasis and elevated PSA are followed at the [place].  Following with 2 urologist is  redundant and may be dangerous especially since I am unable to review his records.  As such, I have advised the patient if he would like to be followed here, he could request records to be sent to [place] and will need to follow-up annually.  Otherwise, he could follow-up with the [place].  Return for request for [facility] urology records.   [name], MD  [facility] [address], [place], [place] [address] [phone]

## 2018-08-21 ENCOUNTER — Other Ambulatory Visit: Payer: Self-pay

## 2018-08-21 ENCOUNTER — Emergency Department
Admission: EM | Admit: 2018-08-21 | Discharge: 2018-08-21 | Disposition: A | Discharge status: Home / Self Care | Payer: Non-veteran care | Attending: Emergency Medicine | Admitting: Emergency Medicine

## 2018-08-21 ENCOUNTER — Encounter: Payer: Self-pay | Admitting: Emergency Medicine

## 2018-08-21 DIAGNOSIS — E1122 Type 2 diabetes mellitus with diabetic chronic kidney disease: Secondary | ICD-10-CM | POA: Diagnosis not present

## 2018-08-21 DIAGNOSIS — N189 Chronic kidney disease, unspecified: Secondary | ICD-10-CM | POA: Insufficient documentation

## 2018-08-21 DIAGNOSIS — R040 Epistaxis: Secondary | ICD-10-CM

## 2018-08-21 DIAGNOSIS — Z96642 Presence of left artificial hip joint: Secondary | ICD-10-CM | POA: Insufficient documentation

## 2018-08-21 DIAGNOSIS — I129 Hypertensive chronic kidney disease with stage 1 through stage 4 chronic kidney disease, or unspecified chronic kidney disease: Secondary | ICD-10-CM | POA: Insufficient documentation

## 2018-08-21 DIAGNOSIS — Z8546 Personal history of malignant neoplasm of prostate: Secondary | ICD-10-CM | POA: Insufficient documentation

## 2018-08-21 NOTE — ED Triage Notes (Signed)
Pt to ED via POV c/o nosebleed. Pt states that he was seen this morning for same. Pt states that he has had 4 nosebleeds since 9 pm last night. Pt states that this is an ongoing issue. Pt is in NAD at this time. Bleeding is controlled at this time.

## 2018-08-21 NOTE — ED Notes (Signed)
Pt has been to [facility] in [place] several times in the last 2 weeks Pt went to ER in [place] this am for nose bleed and was released Pt started with nose bleed at 230pm and was able to stop the bleeding on his own - he reports that his nose "started dripping again" so he came to the ED At this time no bleeding noted and pt in NAD

## 2018-08-21 NOTE — Discharge Instructions (Addendum)
Please do not blow your nose, put your finger in your nose, put any other foreign bodies in your nose, or put to light tissue in your nose.  If you develop bleeding, please apply pressure as we demonstrated, for 15 minutes without peeking.  If it is still bleeding, apply pressure for another 15 minutes without peeking.  If you continue to have bleeding, come to the emergency department for reevaluation.  Return to the emergency department if you develop severe pain, lightheadedness or fainting, bleeding that cannot be controlled at home, or for any other symptoms concerning to you.

## 2018-08-21 NOTE — ED Provider Notes (Signed)
[facility] Emergency Department Provider Note  ____________________________________________  Time seen: Approximately 6:34 PM  I have reviewed the triage vital signs and the nursing notes.   HISTORY  Chief Complaint Epistaxis    HPI Javier Watson is a 72 y.o. male, not anticoagulated, presenting with recurrent right nare epistaxis.  The patient reports that for the past week, he has had multiple episodes of epistaxis, including 2-3 prior ED visits.  Last week, he had a nosebleed which required nasal packing and he was followed up by ear nose and throat surgeon in [place] where he was staying, the packing was removed and the patient had intermittent episodes of bleeding which improved with Vaseline and pressure.  Last night, the patient developed another nosebleed and was seen in the emergency department in [place] and describes likely silver nitrate application with resolution of his epistaxis.  Since then, he has been blowing his nose, shoving toilet paper in his nose, and states that he continues to have bleeding.  He denies any shortness of breath, lightheadedness or fainting, fever.  Past Medical History:  Diagnosis Date  . Anxiety   . Arthritis   . Cancer ([place])    PROSTATE  . Chronic kidney disease    STAGE 3  . 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 Depression   . Diabetes mellitus without complication ([place])    patient follows diet only. no medications  . Essential hypertension   . Keratoderma   . Post traumatic stress disorder (PTSD)   . Sleep apnea    C-PAP    Patient Active Problem List   Diagnosis Date Noted  . SVT (supraventricular tachycardia) ([place]) 05/16/2016  . Elevated troponin 05/16/2016  . Acute kidney injury ([place]) 05/16/2016  . Hyperglycemia 05/16/2016  . Borderline diabetes 05/16/2016  . BPH (benign prostatic hyperplasia) 05/16/2016  . GERD (gastroesophageal reflux disease) 05/16/2016  . Hypokalemia 05/16/2016  .  Hypomagnesemia 05/16/2016  . Obstructive sleep apnea 05/16/2016  . Essential hypertension   . NSTEMI (non-ST elevated myocardial infarction) ([place]) 05/15/2016    Past Surgical History:  Procedure Laterality Date  . Arm Surgery    . BACK SURGERY  1967   Laminectomy on skull of neck  . COLONOSCOPY    . CRANIOTOMY  1967  . CYSTOSCOPY WITH URETEROSCOPY AND STENT PLACEMENT Right 01/2016  . HYDROCELE EXCISION Right 10/21/2016   Procedure: HYDROCELECTOMY ADULT;  Surgeon: [name], MD;  Location: [facility] ORS;  Service: Urology;  Laterality: Right;  . ORTHOPEDIC SURGERY    . TOTAL HIP ARTHROPLASTY Left 2006  . URETEROSCOPY Right 04/2016   stent removal- spesis    Current Outpatient Rx  . Order #: [id number] Class: OTC  . Order #: [id number] Class: Historical Med  . Order #: [id number] Class: Historical Med  . Order #: [id number] Class: Historical Med  . Order #: [id number] Class: Print  . Order #: [id number] Class: Normal  . Order #: [id number] Class: Normal  . Order #: [id number] Class: Historical Med  . Order #: [id number] Class: Historical Med  . Order #: [id number] Class: Historical Med  . Order #: [id number] Class: Print  . Order #: [id number] Class: Normal  . Order #: [id number] Class: Historical Med    Allergies Patient has no known allergies.  Family History  Problem Relation Age of Onset  . Hypertension Father   . Bladder Cancer Neg Hx   . Prostate cancer Neg Hx   . Kidney cancer Neg Hx     Social History Social History   Tobacco Use  .  Smoking status: Never Smoker  . Smokeless tobacco: Never Used  Substance Use Topics  . Alcohol use: No  . Drug use: No    Review of Systems Constitutional: No fever/chills.  No lightheadedness or syncope. Eyes: No visual changes. ENT: No sore throat. No congestion or rhinorrhea.  Positive epistaxis from the right nare. Cardiovascular: Denies chest pain. Denies palpitations. Respiratory: Denies shortness of breath.  No cough. Gastrointestinal: No  nausea, no vomiting.   Skin: Negative for rash. Neurological: Negative for headaches. No focal numbness, tingling or weakness.     ____________________________________________   PHYSICAL EXAM:  VITAL SIGNS: ED Triage Vitals  Enc Vitals Group     BP 08/21/18 1502 (!) 154/59     Pulse Rate 08/21/18 1502 64     Resp 08/21/18 1502 16     Temp 08/21/18 1502 98 F (36.7 C)     Temp Source 08/21/18 1502 Oral     SpO2 08/21/18 1502 95 %     Weight --      Height --      Head Circumference --      Peak Flow --      Pain Score 08/21/18 1503 0     Pain Loc --      Pain Edu? --      Excl. in [place]? --     Constitutional: Alert and oriented.  Answers questions appropriately. Eyes: Conjunctivae are normal and without pallor.  EOMI. No scleral icterus. Head: Atraumatic. Nose: The patient has some dried blood and a very small amount of moist blood in the right nare at the most superior aspect of the nare without any septal hematoma.  When the patient sits upright, there is no active bleeding from the nare.  I cannot appreciate the area that has had silver nitrate application. Mouth/Throat: Mucous membranes are moist.  No blood can be seen in the pharynx. Neck: No stridor.  Supple.   Cardiovascular: Normal rate Respiratory: Normal respiratory effort.  No accessory muscle use or retractions.  Musculoskeletal: Moves all extremities well. Neurologic:  A&Ox3.  Speech is clear.  Face and smile are symmetric.  EOMI.  Moves all extremities well. Skin:  Skin is warm, dry and intact. No rash noted. Psychiatric: Mood and affect are normal. Speech and behavior are normal.  Normal judgement.  ____________________________________________   LABS (all labs ordered are listed, but only abnormal results are displayed)  Labs Reviewed - No data to display ____________________________________________  EKG  Not indicated ____________________________________________  RADIOLOGY  No results  found.  ____________________________________________   PROCEDURES  Procedure(s) performed: None  Procedures  Critical Care performed: No ____________________________________________   INITIAL IMPRESSION / ASSESSMENT AND PLAN / ED COURSE  Pertinent labs & imaging results that were available during my care of the patient were reviewed by me and considered in my medical decision making (see chart for details).  72 y.o. male, not anticoagulated, presenting with recurrent right nare epistaxis.  Overall, the patient is hypertensive to 154/59 but otherwise hemodynamically stable.  On my examination, he does not have active bleeding that would warrant nasal packing.  I have had a long discussion with the patient about preventative steps for nosebleeds, including exclusion of any foreign bodies, blowing his nose, toilet paper in the nose, and the use of humidifier as needed.  We also discussed treatment if he has rebleeding.  I will plan to have him see the ENT surgeon on-call, Dr. Tami Ribas, early next week for reevaluation.  The  patient understands follow-up instructions as well as return precautions.  ____________________________________________  FINAL CLINICAL IMPRESSION(S) / ED DIAGNOSES  Final diagnoses:  Right-sided epistaxis         NEW MEDICATIONS STARTED DURING THIS VISIT:  New Prescriptions   No medications on file      Eula Listen, MD 08/21/18 1839

## 2019-04-25 ENCOUNTER — Other Ambulatory Visit: Payer: Self-pay

## 2019-04-25 ENCOUNTER — Emergency Department: Payer: 59

## 2019-04-25 ENCOUNTER — Telehealth: Payer: Self-pay | Admitting: Urology

## 2019-04-25 ENCOUNTER — Emergency Department
Admission: EM | Admit: 2019-04-25 | Discharge: 2019-04-25 | Disposition: A | Discharge status: Home / Self Care | Payer: 59 | Attending: Emergency Medicine | Admitting: Emergency Medicine

## 2019-04-25 DIAGNOSIS — R82998 Other abnormal findings in urine: Secondary | ICD-10-CM | POA: Diagnosis not present

## 2019-04-25 DIAGNOSIS — E1122 Type 2 diabetes mellitus with diabetic chronic kidney disease: Secondary | ICD-10-CM | POA: Insufficient documentation

## 2019-04-25 DIAGNOSIS — N1330 Unspecified hydronephrosis: Secondary | ICD-10-CM | POA: Insufficient documentation

## 2019-04-25 DIAGNOSIS — I129 Hypertensive chronic kidney disease with stage 1 through stage 4 chronic kidney disease, or unspecified chronic kidney disease: Secondary | ICD-10-CM | POA: Insufficient documentation

## 2019-04-25 DIAGNOSIS — Z96642 Presence of left artificial hip joint: Secondary | ICD-10-CM | POA: Insufficient documentation

## 2019-04-25 DIAGNOSIS — N183 Chronic kidney disease, stage 3 (moderate): Secondary | ICD-10-CM | POA: Diagnosis not present

## 2019-04-25 DIAGNOSIS — I252 Old myocardial infarction: Secondary | ICD-10-CM | POA: Insufficient documentation

## 2019-04-25 DIAGNOSIS — Z8546 Personal history of malignant neoplasm of prostate: Secondary | ICD-10-CM | POA: Insufficient documentation

## 2019-04-25 DIAGNOSIS — N200 Calculus of kidney: Secondary | ICD-10-CM | POA: Insufficient documentation

## 2019-04-25 DIAGNOSIS — R319 Hematuria, unspecified: Secondary | ICD-10-CM | POA: Diagnosis present

## 2019-04-25 LAB — COMPREHENSIVE METABOLIC PANEL
ALT: 17 U/L (ref 0–44)
AST: 19 U/L (ref 15–41)
Albumin: 4.1 g/dL (ref 3.5–5.0)
Alkaline Phosphatase: 81 U/L (ref 38–126)
Anion gap: 10 (ref 5–15)
BUN: 22 mg/dL (ref 8–23)
CO2: 24 mmol/L (ref 22–32)
Calcium: 9 mg/dL (ref 8.9–10.3)
Chloride: 102 mmol/L (ref 98–111)
Creatinine, Ser: 1.52 mg/dL — ABNORMAL HIGH (ref 0.61–1.24)
GFR calc Af Amer: 52 mL/min — ABNORMAL LOW (ref >60)
GFR calc non Af Amer: 45 mL/min — ABNORMAL LOW (ref >60)
Glucose, Bld: 102 mg/dL — ABNORMAL HIGH (ref 70–99)
Potassium: 3.2 mmol/L — ABNORMAL LOW (ref 3.5–5.1)
Sodium: 136 mmol/L (ref 135–145)
Total Bilirubin: 0.9 mg/dL (ref 0.3–1.2)
Total Protein: 7.3 g/dL (ref 6.5–8.1)

## 2019-04-25 LAB — CBC WITH DIFFERENTIAL/PLATELET
Abs Immature Granulocytes: 0.01 10*3/uL (ref 0.00–0.07)
Basophils Absolute: 0 10*3/uL (ref 0.0–0.1)
Basophils Relative: 0 %
Eosinophils Absolute: 0 10*3/uL (ref 0.0–0.5)
Eosinophils Relative: 1 %
HCT: 42.6 % (ref 39.0–52.0)
Hemoglobin: 14.2 g/dL (ref 13.0–17.0)
Immature Granulocytes: 0 %
Lymphocytes Relative: 25 %
Lymphs Abs: 1.4 10*3/uL (ref 0.7–4.0)
MCH: 28.7 pg (ref 26.0–34.0)
MCHC: 33.3 g/dL (ref 30.0–36.0)
MCV: 86.2 fL (ref 80.0–100.0)
Monocytes Absolute: 0.6 10*3/uL (ref 0.1–1.0)
Monocytes Relative: 12 %
Neutro Abs: 3.3 10*3/uL (ref 1.7–7.7)
Neutrophils Relative %: 62 %
Platelets: 170 10*3/uL (ref 150–400)
RBC: 4.94 MIL/uL (ref 4.22–5.81)
RDW: 17.5 % — ABNORMAL HIGH (ref 11.5–15.5)
WBC: 5.4 10*3/uL (ref 4.0–10.5)
nRBC: 0 % (ref 0.0–0.2)

## 2019-04-25 LAB — URINALYSIS, COMPLETE (UACMP) WITH MICROSCOPIC
Bacteria, UA: NONE SEEN
Bilirubin Urine: NEGATIVE
Glucose, UA: NEGATIVE mg/dL
Ketones, ur: NEGATIVE mg/dL
Leukocytes,Ua: NEGATIVE
Nitrite: NEGATIVE
Protein, ur: 100 mg/dL — AB
RBC / HPF: >50 RBC/hpf — ABNORMAL HIGH (ref 0–5)
Specific Gravity, Urine: 1.013 (ref 1.005–1.030)
Squamous Epithelial / LPF: NONE SEEN (ref 0–5)
pH: 6 (ref 5.0–8.0)

## 2019-04-25 MED ORDER — TAMSULOSIN HCL 0.4 MG PO CAPS: 0.4000 mg | ORAL_CAPSULE | Freq: Every day | ORAL | 0 refills | Status: DC

## 2019-04-25 MED ORDER — CIPROFLOXACIN HCL 500 MG PO TABS: 500.0000 mg | ORAL_TABLET | Freq: Two times a day (BID) | ORAL | 0 refills | Status: DC

## 2019-04-25 NOTE — Discharge Instructions (Signed)
Call Dr. Cherrie Gauze office today or in the morning for an office appointment for follow-up.  Begin taking medication as directed with Cipro being 1 tablet twice a day and Flomax once a day.  Increase fluids.  Return to the emergency department if any severe worsening of your symptoms such as nausea, vomiting, fever or chills.

## 2019-04-25 NOTE — ED Triage Notes (Signed)
Pt c/o brown/red colored urine for over a month, states he has been to the urgent care and states they did show any infection or abnormal labs. Pt denies any pain or other sx.

## 2019-04-25 NOTE — ED Provider Notes (Signed)
[facility] Emergency Department Provider Note  ____________________________________________   First MD Initiated Contact with Patient 04/25/19 1149     (approximate)  I have reviewed the triage vital signs and the nursing notes.   HISTORY  Chief Complaint Hematuria   HPI Javier Watson is a 73 y.o. male presents to the ED with complaint of hematuria this morning.  Patient states that he has had discolored urine off and on over the past month.  He states he was seen at an urgent care who put him on penicillin for a urinary tract infection although they told him that they did not see anything in his urine.  He also went to the [place] approximately 1-1/2 months ago for the same and was told again that there was nothing in his urine but placed again on penicillin.  Patient denies any injury, dysuria, nausea, vomiting, or flank pain.  He also has not experienced any fever or chills.  He states in May when he was at the [facility] he was told that his "kidney levels" were stable.     Past Medical History:  Diagnosis Date  . Anxiety   . Arthritis   . Cancer ([place])    PROSTATE  . Chronic kidney disease    STAGE 3  . 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 Depression   . Diabetes mellitus without complication ([place])    patient follows diet only. no medications  . Essential hypertension   . Keratoderma   . Post traumatic stress disorder (PTSD)   . Sleep apnea    C-PAP    Patient Active Problem List   Diagnosis Date Noted  . SVT (supraventricular tachycardia) ([place]) 05/16/2016  . Elevated troponin 05/16/2016  . Acute kidney injury ([place]) 05/16/2016  . Hyperglycemia 05/16/2016  . Borderline diabetes 05/16/2016  . BPH (benign prostatic hyperplasia) 05/16/2016  . GERD (gastroesophageal reflux disease) 05/16/2016  . Hypokalemia 05/16/2016  . Hypomagnesemia 05/16/2016  . Obstructive sleep apnea 05/16/2016  . Essential hypertension   . NSTEMI (non-ST elevated  myocardial infarction) ([place]) 05/15/2016    Past Surgical History:  Procedure Laterality Date  . Arm Surgery    . BACK SURGERY  1967   Laminectomy on skull of neck  . COLONOSCOPY    . CRANIOTOMY  1967  . CYSTOSCOPY WITH URETEROSCOPY AND STENT PLACEMENT Right 01/2016  . HYDROCELE EXCISION Right 10/21/2016   Procedure: HYDROCELECTOMY ADULT;  Surgeon: [name], MD;  Location: [facility] ORS;  Service: Urology;  Laterality: Right;  . ORTHOPEDIC SURGERY    . TOTAL HIP ARTHROPLASTY Left 2006  . URETEROSCOPY Right 04/2016   stent removal- spesis    Prior to Admission medications   Medication Sig Start Date End Date Taking? Authorizing Provider  aspirin 81 MG chewable tablet Chew 1 tablet (81 mg total) by mouth daily. 05/16/16   Theora Gianotti, NP  buPROPion (WELLBUTRIN SR) 100 MG 12 hr tablet Take 200 mg by mouth 2 (two) times daily. 02/08/16   [provider]  ciprofloxacin (CIPRO) 500 MG tablet Take 1 tablet (500 mg total) by mouth 2 (two) times daily. 04/25/19   Johnn Hai, PA-C  citalopram (CELEXA) 40 MG tablet Take 40 mg by mouth daily. 02/22/16   [provider]  diltiazem (TIAZAC) 360 MG 24 hr capsule Take 360 mg by mouth daily.  09/24/16   [provider]  docusate sodium (COLACE) 100 MG capsule Take 1 capsule (100 mg total)  by mouth 2 (two) times daily. 10/21/16   [name], MD  hydrALAZINE (APRESOLINE) 10 MG tablet Take 1 tablet (10 mg total) by mouth 3 (three) times daily. Patient taking differently: Take 25 mg by mouth 3 (three) times daily.  05/16/16   [name], MD  losartan (COZAAR) 100 MG tablet Take 1 tablet (100 mg total) by mouth at bedtime. 05/17/16   [name], MD  morphine (MSIR) 15 MG tablet Take 15 mg by mouth 2 (two) times daily as needed for severe pain.    [provider]  Multiple Vitamin (MULTIVITAMIN WITH MINERALS) TABS tablet Take 1 tablet by mouth daily.    [provider]  Omega-3 Fatty Acids (FISH  OIL) 1000 MG CAPS Take 1,000 mg by mouth daily.    [provider]  oxycodone (OXY-IR) 5 MG capsule Take 1 capsule (5 mg total) by mouth every 4 (four) hours as needed for pain. 10/21/16   [name], MD  potassium chloride SA (K-DUR,KLOR-CON) 20 MEQ tablet Take 1 tablet (20 mEq total) by mouth daily. Patient taking differently: Take 10 mEq by mouth daily.  05/16/16   Theora Gianotti, NP  prazosin (MINIPRESS) 5 MG capsule Take 1 capsule by mouth 2 (two) times daily. Reported on 05/15/2016 02/08/16   [provider]  tamsulosin (FLOMAX) 0.4 MG CAPS capsule Take 1 capsule (0.4 mg total) by mouth daily. 04/25/19   Johnn Hai, PA-C    Allergies Patient has no known allergies.  Family History  Problem Relation Age of Onset  . Hypertension Father   . Bladder Cancer Neg Hx   . Prostate cancer Neg Hx   . Kidney cancer Neg Hx     Social History Social History   Tobacco Use  . Smoking status: Never Smoker  . Smokeless tobacco: Never Used  Substance Use Topics  . Alcohol use: No  . Drug use: No    Review of Systems Constitutional: No fever/chills Cardiovascular: Denies chest pain. Respiratory: Denies shortness of breath. Gastrointestinal: No abdominal pain.  No nausea, no vomiting.  Genitourinary: Negative for dysuria.  Positive for intermittent hematuria. Musculoskeletal: Negative for back pain.  Negative for flank pain. Skin: Negative for rash. Neurological: Negative for headaches, focal weakness or numbness.   ____________________________________________   PHYSICAL EXAM:  VITAL SIGNS: ED Triage Vitals  Enc Vitals Group     BP 04/25/19 1047 (!) 179/73     Pulse Rate 04/25/19 1047 63     Resp 04/25/19 1047 18     Temp 04/25/19 1047 98.1 F (36.7 C)     Temp Source 04/25/19 1047 Oral     SpO2 04/25/19 1047 99 %     Weight 04/25/19 1048 230 lb (104.3 kg)     Height 04/25/19 1048 5\' 8"  (1.727 m)     Head Circumference --      Peak Flow  --      Pain Score 04/25/19 1048 0     Pain Loc --      Pain Edu? --      Excl. in [place]? --    Constitutional: Alert and oriented. Well appearing and in no acute distress. Eyes: Conjunctivae are normal.  Head: Atraumatic. Neck: No stridor.   Cardiovascular: Normal rate, regular rhythm. Grossly normal heart sounds.  Good peripheral circulation. Respiratory: Normal respiratory effort.  No retractions. Lungs CTAB. Gastrointestinal: Soft and nontender. No distention.  No CVA tenderness. Musculoskeletal: Moves upper and lower extremities without any difficulty and  normal gait was normal and unassisted. Neurologic:  Normal speech and language. No gross focal neurologic deficits are appreciated. No gait instability. Skin:  Skin is warm, dry and intact. No rash noted. Psychiatric: Mood and affect are normal. Speech and behavior are normal.  ____________________________________________   LABS (all labs ordered are listed, but only abnormal results are displayed)  Labs Reviewed  URINALYSIS, COMPLETE (UACMP) WITH MICROSCOPIC - Abnormal; Notable for the following components:      Result Value   Color, Urine RED (*)    APPearance CLOUDY (*)    Hgb urine dipstick LARGE (*)    Protein, ur 100 (*)    RBC / HPF >50 (*)    All other components within normal limits  COMPREHENSIVE METABOLIC PANEL - Abnormal; Notable for the following components:   Potassium 3.2 (*)    Glucose, Bld 102 (*)    Creatinine, Ser 1.52 (*)    GFR calc non Af Amer 45 (*)    GFR calc Af Amer 52 (*)    All other components within normal limits  CBC WITH DIFFERENTIAL/PLATELET - Abnormal; Notable for the following components:   RDW 17.5 (*)    All other components within normal limits  URINE CULTURE    RADIOLOGY  Official radiology report(s): Ct Renal Stone Study  Result Date: 04/25/2019 CLINICAL DATA:  Discolored urine and history of prostate carcinoma EXAM: CT ABDOMEN AND PELVIS WITHOUT CONTRAST TECHNIQUE:  Multidetector CT imaging of the abdomen and pelvis was performed following the standard protocol without IV contrast. COMPARISON:  None. FINDINGS: Lower chest: Lung bases are free of acute infiltrate or sizable effusion. A calcified granuloma is noted in the right lower lobe. Hepatobiliary: No focal liver abnormality is seen. No gallstones, gallbladder wall thickening, or biliary dilatation. Pancreas: Unremarkable. No pancreatic ductal dilatation or surrounding inflammatory changes. Spleen: Normal in size without focal abnormality. Adrenals/Urinary Tract: Adrenal glands are within normal limits. The left kidney demonstrates no renal calculi or obstructive changes. Scattered small right renal calculi are noted as well as obstructive change of the collecting system and ureter secondary to an 8 mm distal right ureteral stone. Bladder is partially distended. Stomach/Bowel: The appendix is within normal limits. No obstructive or inflammatory changes of large or small bowel are seen. Small sliding-type hiatal hernia is noted. Vascular/Lymphatic: Aortic calcifications are noted with mild ectasia and tortuosity of the abdominal aorta. Reproductive: Prostate is enlarged consistent with the given clinical history. Other: No abdominal wall hernia or abnormality. No abdominopelvic ascites. Musculoskeletal: Left hip prosthesis is noted. Degenerative changes of lumbar spine are noted. IMPRESSION: 8 mm distal right ureteral stone causing significant hydronephrosis and hydroureter. Changes of prior granulomatous disease. Electronically Signed   By: Inez Catalina M.D.   On: 04/25/2019 13:38    ____________________________________________   PROCEDURES  Procedure(s) performed (including Critical Care):  Procedures   ____________________________________________   INITIAL IMPRESSION / ASSESSMENT AND PLAN / ED COURSE  As part of my medical decision making, I reviewed the following data within the electronic medical  record:  Notes from prior ED visits and [place] Controlled Substance Database  73 year old male presents to the ED with complaint of intermittent hematuria over the last month and a half.  He has been treated twice with what he believes to be penicillin from both the [facility] and in urgent care.  He denies any fever, chills, nausea or vomiting.  He denies any flank tenderness or history of stones.  CT scan shows 8 mm  right renal stone with hydronephrosis.  Creatinine was 1.52 and with records available there were some creatinines noted to be higher than this.  Urinalysis showed greater than 50 RBCs with WBCs been 21-50.  Urine culture was ordered.  All testing results was discussed with patient.  He states that he has lost faith in the [place] hospital and prefers to see the urologist that he has seen in [place].  Records were sent to Dr. Erlene Quan.  Patient will call the office to be seen.  He was discharged with a prescription for Cipro 500 mg twice daily and Flomax daily.  ____________________________________________   FINAL CLINICAL IMPRESSION(S) / ED DIAGNOSES  Final diagnoses:  Right renal stone  Hydronephrosis of right kidney     ED Discharge Orders         Ordered    tamsulosin (FLOMAX) 0.4 MG CAPS capsule  Daily     04/25/19 1510    ciprofloxacin (CIPRO) 500 MG tablet  2 times daily     04/25/19 1510           Note:  This document was prepared using Dragon voice recognition software and may include unintentional dictation errors.    Johnn Hai, PA-C 04/25/19 1552    Earleen Newport, MD 04/26/19 [phone]

## 2019-04-25 NOTE — Telephone Encounter (Deleted)
-----   Message from [name], MD sent at 04/25/2019  3:20 PM EDT ----- Regarding: FW: Hematuria and 8 mm stone Please schedule f/u ASAP with any provider for this large stone.  [place] is fine.  He may be good lithotripsy candidate.  [name], MD ----- Message ----- From: [place] Course Sent: 04/25/2019   2:52 PM EDT To: [name], MD Subject: Hematuria and 8 mm stone                       Mr. Hennington is present in the ED now with c/o hematuria.  CT shows 8 mm distal right ureteral stone causing significant hydronephrosis.  U/A with RBC's and WBC's.  Creat. Improved since the last records we can see.  Mr. Kundrat would like to follow up with you instead of [place] [facility] from now on.  Could you see him in your office?

## 2019-04-25 NOTE — Telephone Encounter (Signed)
Lm to cb to schedule app with any provider asap   Sharyn Lull

## 2019-04-25 NOTE — ED Notes (Signed)
See triage note  Presents with blood in urine  States this has happened about 1 1/2 mons ago  Was seen at Urgent care  States it cleared up but returned about 3 weeks ago

## 2019-04-26 ENCOUNTER — Encounter: Payer: Self-pay | Admitting: Urology

## 2019-04-26 ENCOUNTER — Ambulatory Visit (INDEPENDENT_AMBULATORY_CARE_PROVIDER_SITE_OTHER): Payer: 59 | Admitting: Urology

## 2019-04-26 VITALS — BP 155/65 | HR 69 | Ht 68.0 in | Wt 231.0 lb

## 2019-04-26 DIAGNOSIS — N201 Calculus of ureter: Secondary | ICD-10-CM | POA: Diagnosis not present

## 2019-04-26 DIAGNOSIS — N1330 Unspecified hydronephrosis: Secondary | ICD-10-CM | POA: Diagnosis not present

## 2019-04-26 DIAGNOSIS — R310 Gross hematuria: Secondary | ICD-10-CM

## 2019-04-26 LAB — URINALYSIS, COMPLETE
Bilirubin, UA: NEGATIVE
Glucose, UA: NEGATIVE
Ketones, UA: NEGATIVE
Leukocytes,UA: NEGATIVE
Nitrite, UA: NEGATIVE
Protein,UA: NEGATIVE
RBC, UA: NEGATIVE
Specific Gravity, UA: 1.015 (ref 1.005–1.030)
Urobilinogen, Ur: 0.2 mg/dL (ref 0.2–1.0)
pH, UA: 6 (ref 5.0–7.5)

## 2019-04-26 LAB — MICROSCOPIC EXAMINATION
Bacteria, UA: NONE SEEN
RBC: NONE SEEN /hpf (ref 0–2)

## 2019-04-26 NOTE — Progress Notes (Signed)
04/26/2019 3:08 PM   Javier Watson May 10, 1946 [id number]  Referring provider: No referring provider defined for this encounter.  Chief Complaint  Patient presents with  . Nephrolithiasis    discuss surgery    HPI: 73 yo M with 8 mm right distal ureteral stone who presents today for further evaluation of this.  He was seen in the emergency room just yesterday.  He presented to urgent care in April with painless gross hematuria.  He was treated with abx for presumed infection.    His hematuria recurreed yesterday thus he return to the ED.  CT scan showed 8 mm right distal ureteral   No flanik pain,  No fevers or chills.  No dysuria or urinry issues.    He does have a personal history of kidney stones.  He had an eventful ureteroscopy several years ago at the [place] which sounds like resulted in sepsis.  He also continues to believe that this affected his ability to ejaculate.   PMH: Past Medical History:  Diagnosis Date  . Anxiety   . Arthritis   . Cancer ([place])    PROSTATE  . Chronic kidney disease    STAGE 3  . 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 Depression   . Diabetes mellitus without complication ([place])    patient follows diet only. no medications  . Essential hypertension   . Keratoderma   . Post traumatic stress disorder (PTSD)   . Sleep apnea    C-PAP    Surgical History: Past Surgical History:  Procedure Laterality Date  . Arm Surgery    . BACK SURGERY  1967   Laminectomy on skull of neck  . COLONOSCOPY    . CRANIOTOMY  1967  . CYSTOSCOPY WITH URETEROSCOPY AND STENT PLACEMENT Right 01/2016  . HYDROCELE EXCISION Right 10/21/2016   Procedure: HYDROCELECTOMY ADULT;  Surgeon: [name], MD;  Location: [facility] ORS;  Service: Urology;  Laterality: Right;  . ORTHOPEDIC SURGERY    . TOTAL HIP ARTHROPLASTY Left 2006  . URETEROSCOPY Right 04/2016   stent removal- spesis    Home Medications:  Allergies as of 04/26/2019   No Known Allergies      Medication List       Accurate as of April 26, 2019  3:08 PM. If you have any questions, ask your nurse or doctor.        aspirin 81 MG chewable tablet Chew 1 tablet (81 mg total) by mouth daily.   buPROPion 100 MG 12 hr tablet Commonly known as:  WELLBUTRIN SR Take 200 mg by mouth 2 (two) times daily.   ciprofloxacin 500 MG tablet Commonly known as:  CIPRO Take 1 tablet (500 mg total) by mouth 2 (two) times daily.   citalopram 40 MG tablet Commonly known as:  CELEXA Take 40 mg by mouth daily.   diltiazem 360 MG 24 hr capsule Commonly known as:  TIAZAC Take 360 mg by mouth daily.   docusate sodium 100 MG capsule Commonly known as:  COLACE Take 1 capsule (100 mg total) by mouth 2 (two) times daily.   Fish Oil 1000 MG Caps Take 1,000 mg by mouth daily.   hydrALAZINE 10 MG tablet Commonly known as:  APRESOLINE Take 1 tablet (10 mg total) by mouth 3 (three) times daily. What changed:  how much to take   losartan 100 MG tablet Commonly known as:  COZAAR Take 1 tablet (100 mg total) by mouth at bedtime.  morphine 15 MG tablet Commonly known as:  MSIR Take 15 mg by mouth 2 (two) times daily as needed for severe pain.   multivitamin with minerals Tabs tablet Take 1 tablet by mouth daily.   oxycodone 5 MG capsule Commonly known as:  OXY-IR Take 1 capsule (5 mg total) by mouth every 4 (four) hours as needed for pain.   potassium chloride SA 20 MEQ tablet Commonly known as:  K-DUR Take 1 tablet (20 mEq total) by mouth daily. What changed:  how much to take   prazosin 5 MG capsule Commonly known as:  MINIPRESS Take 1 capsule by mouth 2 (two) times daily. Reported on 05/15/2016   tamsulosin 0.4 MG Caps capsule Commonly known as:  Flomax Take 1 capsule (0.4 mg total) by mouth daily.       Allergies: No Known Allergies  Family History: Family History  Problem Relation Age of Onset  . Hypertension Father   . Bladder Cancer Neg Hx   . Prostate cancer Neg Hx    . Kidney cancer Neg Hx     Social History:  reports that he has never smoked. He has never used smokeless tobacco. He reports that he does not drink alcohol or use drugs.  ROS: UROLOGY Frequent Urination?: No Hard to postpone urination?: No Burning/pain with urination?: No Get up at night to urinate?: No Leakage of urine?: No Urine stream starts and stops?: No Trouble starting stream?: No Do you have to strain to urinate?: No Blood in urine?: Yes Urinary tract infection?: No Sexually transmitted disease?: No Injury to kidneys or bladder?: No Painful intercourse?: No Weak stream?: No Erection problems?: No Penile pain?: No  Gastrointestinal Nausea?: No Vomiting?: No Indigestion/heartburn?: No Diarrhea?: No Constipation?: No  Constitutional Fever: No Night sweats?: No Weight loss?: No Fatigue?: No  Skin Skin rash/lesions?: No Itching?: No  Eyes Blurred vision?: No Double vision?: No  Ears/Nose/Throat Sore throat?: No Sinus problems?: No  Hematologic/Lymphatic Swollen glands?: No Easy bruising?: No  Cardiovascular Leg swelling?: No Chest pain?: No  Respiratory Cough?: No Shortness of breath?: No  Endocrine Excessive thirst?: No  Musculoskeletal Back pain?: No Joint pain?: No  Neurological Headaches?: No Dizziness?: No  Psychologic Depression?: No Anxiety?: No  Physical Exam: BP (!) 155/65   Pulse 69   Ht 5\' 8"  (1.727 m)   Wt 231 lb (104.8 kg)   BMI 35.12 kg/m   Constitutional:  Alert and oriented, No acute distress. HEENT: [place] AT, moist mucus membranes.  Trachea midline, no masses. Cardiovascular: No clubbing, cyanosis, or edema. RRR. Respiratory: Normal respiratory effort, no increased work of breathing. GI: Abdomen is soft, nontender, nondistended, no abdominal masses GU: No CVA tenderness Skin: No rashes, bruises or suspicious lesions. Neurologic: Grossly intact, no focal deficits, moving all 4 extremities. Psychiatric: Normal  mood and affect.  Laboratory Data: Lab Results  Component Value Date   WBC 5.4 04/25/2019   HGB 14.2 04/25/2019   HCT 42.6 04/25/2019   MCV 86.2 04/25/2019   PLT 170 04/25/2019    Lab Results  Component Value Date   CREATININE 1.52 (H) 04/25/2019    Lab Results  Component Value Date   HGBA1C 6.2 (H) 05/15/2016    Urinalysis    Component Value Date/Time   COLORURINE RED (A) 04/25/2019 1050   APPEARANCEUR CLOUDY (A) 04/25/2019 1050   LABSPEC 1.013 04/25/2019 1050   PHURINE 6.0 04/25/2019 1050   GLUCOSEU NEGATIVE 04/25/2019 1050   HGBUR LARGE (A) 04/25/2019 1050  BILIRUBINUR NEGATIVE 04/25/2019 [place] 04/25/2019 1050   PROTEINUR 100 (A) 04/25/2019 1050   NITRITE NEGATIVE 04/25/2019 [place] 04/25/2019 1050    Lab Results  Component Value Date   BACTERIA NONE SEEN 04/25/2019    Pertinent Imaging: Results for orders placed during the hospital encounter of 04/25/19  CT Renal Stone Study   Narrative CLINICAL DATA:  Discolored urine and history of prostate carcinoma  EXAM: CT ABDOMEN AND PELVIS WITHOUT CONTRAST  TECHNIQUE: Multidetector CT imaging of the abdomen and pelvis was performed following the standard protocol without IV contrast.  COMPARISON:  None.  FINDINGS: Lower chest: Lung bases are free of acute infiltrate or sizable effusion. A calcified granuloma is noted in the right lower lobe.  Hepatobiliary: No focal liver abnormality is seen. No gallstones, gallbladder wall thickening, or biliary dilatation.  Pancreas: Unremarkable. No pancreatic ductal dilatation or surrounding inflammatory changes.  Spleen: Normal in size without focal abnormality.  Adrenals/Urinary Tract: Adrenal glands are within normal limits. The left kidney demonstrates no renal calculi or obstructive changes. Scattered small right renal calculi are noted as well as obstructive change of the collecting system and ureter secondary to an  8 mm distal right ureteral stone. Bladder is partially distended.  Stomach/Bowel: The appendix is within normal limits. No obstructive or inflammatory changes of large or small bowel are seen. Small sliding-type hiatal hernia is noted.  Vascular/Lymphatic: Aortic calcifications are noted with mild ectasia and tortuosity of the abdominal aorta.  Reproductive: Prostate is enlarged consistent with the given clinical history.  Other: No abdominal wall hernia or abnormality. No abdominopelvic ascites.  Musculoskeletal: Left hip prosthesis is noted. Degenerative changes of lumbar spine are noted.  IMPRESSION: 8 mm distal right ureteral stone causing significant hydronephrosis and hydroureter.  Changes of prior granulomatous disease.   Electronically Signed   By: Inez Catalina M.D.   On: 04/25/2019 13:38    CT scan personally reviewed today.  Agree with radiologic interpretation.  Assessment & Plan:    1. Right ureteral stone 8 mm right distal ureteral calculus, persistent greater than 30 days Given the size and location of the stone, I recommend surgical intervention given failure to pass the stone spontaneously We discussed various treatment options including ESWL vs. ureteroscopy, laser lithotripsy, and stent. We discussed the risks and benefits of both including bleeding, infection, damage to surrounding structures, efficacy with need for possible further intervention, and need for temporary ureteral stent.\ A lengthy discussion today about his previous history with ureteroscopy and addressed his concerns.  We will cover him appropriately with antibiotic.  This will not affect his ability to orgasm. - Urinalysis, Complete - Urine culture  2. Hydronephrosis, right Presumably secondary to #1  3. Gross hematuria Presumably secondary to #1  Schedule right ureteroscopy, laser lithotripsy and right ureteral stent placement  [name], MD  [facility] [address] [place], [place] [address] [phone]

## 2019-04-26 NOTE — H&P (View-Only) (Signed)
04/26/2019 3:08 PM   Javier Watson 24-Dec-1945 [id number]  Referring provider: No referring provider defined for this encounter.  Chief Complaint  Patient presents with  . Nephrolithiasis    discuss surgery    HPI: 73 yo M with 8 mm right distal ureteral stone who presents today for further evaluation of this.  He was seen in the emergency room just yesterday.  He presented to urgent care in April with painless gross hematuria.  He was treated with abx for presumed infection.    His hematuria recurreed yesterday thus he return to the ED.  CT scan showed 8 mm right distal ureteral   No flanik pain,  No fevers or chills.  No dysuria or urinry issues.    He does have a personal history of kidney stones.  He had an eventful ureteroscopy several years ago at the [place] which sounds like resulted in sepsis.  He also continues to believe that this affected his ability to ejaculate.   PMH: Past Medical History:  Diagnosis Date  . Anxiety   . Arthritis   . Cancer ([place])    PROSTATE  . Chronic kidney disease    STAGE 3  . 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 Depression   . Diabetes mellitus without complication ([place])    patient follows diet only. no medications  . Essential hypertension   . Keratoderma   . Post traumatic stress disorder (PTSD)   . Sleep apnea    C-PAP    Surgical History: Past Surgical History:  Procedure Laterality Date  . Arm Surgery    . BACK SURGERY  1967   Laminectomy on skull of neck  . COLONOSCOPY    . CRANIOTOMY  1967  . CYSTOSCOPY WITH URETEROSCOPY AND STENT PLACEMENT Right 01/2016  . HYDROCELE EXCISION Right 10/21/2016   Procedure: HYDROCELECTOMY ADULT;  Surgeon: [name], MD;  Location: [facility] ORS;  Service: Urology;  Laterality: Right;  . ORTHOPEDIC SURGERY    . TOTAL HIP ARTHROPLASTY Left 2006  . URETEROSCOPY Right 04/2016   stent removal- spesis    Home Medications:  Allergies as of 04/26/2019   No Known Allergies      Medication List       Accurate as of April 26, 2019  3:08 PM. If you have any questions, ask your nurse or doctor.        aspirin 81 MG chewable tablet Chew 1 tablet (81 mg total) by mouth daily.   buPROPion 100 MG 12 hr tablet Commonly known as:  WELLBUTRIN SR Take 200 mg by mouth 2 (two) times daily.   ciprofloxacin 500 MG tablet Commonly known as:  CIPRO Take 1 tablet (500 mg total) by mouth 2 (two) times daily.   citalopram 40 MG tablet Commonly known as:  CELEXA Take 40 mg by mouth daily.   diltiazem 360 MG 24 hr capsule Commonly known as:  TIAZAC Take 360 mg by mouth daily.   docusate sodium 100 MG capsule Commonly known as:  COLACE Take 1 capsule (100 mg total) by mouth 2 (two) times daily.   Fish Oil 1000 MG Caps Take 1,000 mg by mouth daily.   hydrALAZINE 10 MG tablet Commonly known as:  APRESOLINE Take 1 tablet (10 mg total) by mouth 3 (three) times daily. What changed:  how much to take   losartan 100 MG tablet Commonly known as:  COZAAR Take 1 tablet (100 mg total) by mouth at bedtime.  morphine 15 MG tablet Commonly known as:  MSIR Take 15 mg by mouth 2 (two) times daily as needed for severe pain.   multivitamin with minerals Tabs tablet Take 1 tablet by mouth daily.   oxycodone 5 MG capsule Commonly known as:  OXY-IR Take 1 capsule (5 mg total) by mouth every 4 (four) hours as needed for pain.   potassium chloride SA 20 MEQ tablet Commonly known as:  K-DUR Take 1 tablet (20 mEq total) by mouth daily. What changed:  how much to take   prazosin 5 MG capsule Commonly known as:  MINIPRESS Take 1 capsule by mouth 2 (two) times daily. Reported on 05/15/2016   tamsulosin 0.4 MG Caps capsule Commonly known as:  Flomax Take 1 capsule (0.4 mg total) by mouth daily.       Allergies: No Known Allergies  Family History: Family History  Problem Relation Age of Onset  . Hypertension Father   . Bladder Cancer Neg Hx   . Prostate cancer Neg Hx    . Kidney cancer Neg Hx     Social History:  reports that he has never smoked. He has never used smokeless tobacco. He reports that he does not drink alcohol or use drugs.  ROS: UROLOGY Frequent Urination?: No Hard to postpone urination?: No Burning/pain with urination?: No Get up at night to urinate?: No Leakage of urine?: No Urine stream starts and stops?: No Trouble starting stream?: No Do you have to strain to urinate?: No Blood in urine?: Yes Urinary tract infection?: No Sexually transmitted disease?: No Injury to kidneys or bladder?: No Painful intercourse?: No Weak stream?: No Erection problems?: No Penile pain?: No  Gastrointestinal Nausea?: No Vomiting?: No Indigestion/heartburn?: No Diarrhea?: No Constipation?: No  Constitutional Fever: No Night sweats?: No Weight loss?: No Fatigue?: No  Skin Skin rash/lesions?: No Itching?: No  Eyes Blurred vision?: No Double vision?: No  Ears/Nose/Throat Sore throat?: No Sinus problems?: No  Hematologic/Lymphatic Swollen glands?: No Easy bruising?: No  Cardiovascular Leg swelling?: No Chest pain?: No  Respiratory Cough?: No Shortness of breath?: No  Endocrine Excessive thirst?: No  Musculoskeletal Back pain?: No Joint pain?: No  Neurological Headaches?: No Dizziness?: No  Psychologic Depression?: No Anxiety?: No  Physical Exam: BP (!) 155/65   Pulse 69   Ht 5\' 8"  (1.727 m)   Wt 231 lb (104.8 kg)   BMI 35.12 kg/m   Constitutional:  Alert and oriented, No acute distress. HEENT:  AT, moist mucus membranes.  Trachea midline, no masses. Cardiovascular: No clubbing, cyanosis, or edema. RRR. Respiratory: Normal respiratory effort, no increased work of breathing. GI: Abdomen is soft, nontender, nondistended, no abdominal masses GU: No CVA tenderness Skin: No rashes, bruises or suspicious lesions. Neurologic: Grossly intact, no focal deficits, moving all 4 extremities. Psychiatric: Normal  mood and affect.  Laboratory Data: Lab Results  Component Value Date   WBC 5.4 04/25/2019   HGB 14.2 04/25/2019   HCT 42.6 04/25/2019   MCV 86.2 04/25/2019   PLT 170 04/25/2019    Lab Results  Component Value Date   CREATININE 1.52 (H) 04/25/2019    Lab Results  Component Value Date   HGBA1C 6.2 (H) 05/15/2016    Urinalysis    Component Value Date/Time   COLORURINE RED (A) 04/25/2019 1050   APPEARANCEUR CLOUDY (A) 04/25/2019 1050   LABSPEC 1.013 04/25/2019 1050   PHURINE 6.0 04/25/2019 1050   GLUCOSEU NEGATIVE 04/25/2019 1050   HGBUR LARGE (A) 04/25/2019 1050  BILIRUBINUR NEGATIVE 04/25/2019 [place] 04/25/2019 1050   PROTEINUR 100 (A) 04/25/2019 1050   NITRITE NEGATIVE 04/25/2019 [place] 04/25/2019 1050    Lab Results  Component Value Date   BACTERIA NONE SEEN 04/25/2019    Pertinent Imaging: Results for orders placed during the hospital encounter of 04/25/19  CT Renal Stone Study   Narrative CLINICAL DATA:  Discolored urine and history of prostate carcinoma  EXAM: CT ABDOMEN AND PELVIS WITHOUT CONTRAST  TECHNIQUE: Multidetector CT imaging of the abdomen and pelvis was performed following the standard protocol without IV contrast.  COMPARISON:  None.  FINDINGS: Lower chest: Lung bases are free of acute infiltrate or sizable effusion. A calcified granuloma is noted in the right lower lobe.  Hepatobiliary: No focal liver abnormality is seen. No gallstones, gallbladder wall thickening, or biliary dilatation.  Pancreas: Unremarkable. No pancreatic ductal dilatation or surrounding inflammatory changes.  Spleen: Normal in size without focal abnormality.  Adrenals/Urinary Tract: Adrenal glands are within normal limits. The left kidney demonstrates no renal calculi or obstructive changes. Scattered small right renal calculi are noted as well as obstructive change of the collecting system and ureter secondary to an  8 mm distal right ureteral stone. Bladder is partially distended.  Stomach/Bowel: The appendix is within normal limits. No obstructive or inflammatory changes of large or small bowel are seen. Small sliding-type hiatal hernia is noted.  Vascular/Lymphatic: Aortic calcifications are noted with mild ectasia and tortuosity of the abdominal aorta.  Reproductive: Prostate is enlarged consistent with the given clinical history.  Other: No abdominal wall hernia or abnormality. No abdominopelvic ascites.  Musculoskeletal: Left hip prosthesis is noted. Degenerative changes of lumbar spine are noted.  IMPRESSION: 8 mm distal right ureteral stone causing significant hydronephrosis and hydroureter.  Changes of prior granulomatous disease.   Electronically Signed   By: Inez Catalina M.D.   On: 04/25/2019 13:38    CT scan personally reviewed today.  Agree with radiologic interpretation.  Assessment & Plan:    1. Right ureteral stone 8 mm right distal ureteral calculus, persistent greater than 30 days Given the size and location of the stone, I recommend surgical intervention given failure to pass the stone spontaneously We discussed various treatment options including ESWL vs. ureteroscopy, laser lithotripsy, and stent. We discussed the risks and benefits of both including bleeding, infection, damage to surrounding structures, efficacy with need for possible further intervention, and need for temporary ureteral stent.\ A lengthy discussion today about his previous history with ureteroscopy and addressed his concerns.  We will cover him appropriately with antibiotic.  This will not affect his ability to orgasm. - Urinalysis, Complete - Urine culture  2. Hydronephrosis, right Presumably secondary to #1  3. Gross hematuria Presumably secondary to #1  Schedule right ureteroscopy, laser lithotripsy and right ureteral stent placement  [name], MD  [facility] [address] [place], [place] [address] [phone]

## 2019-04-27 ENCOUNTER — Telehealth: Payer: Self-pay | Admitting: Radiology

## 2019-04-27 ENCOUNTER — Other Ambulatory Visit: Payer: Self-pay | Admitting: Radiology

## 2019-04-27 DIAGNOSIS — N200 Calculus of kidney: Secondary | ICD-10-CM

## 2019-04-27 LAB — URINE CULTURE
Culture: 20000 — AB
Special Requests: NORMAL

## 2019-04-27 NOTE — Telephone Encounter (Signed)
Discussed the [facility] Information form below over the phone with patient.   [place], [place], [place] [address] Telephone: [phone] Fax: [phone]   Thank you for choosing [facility] for your upcoming surgery!  We are always here to assist in your urological needs.  Please read the following information with specific details for your upcoming appointments related to your surgery. Please contact Viviann Broyles at [phone] Option 3 with any questions.  The Name of Your Surgery: Right ureteroscopy, laser lithotripsy, stone removal, ureteral stent placement Your Surgery Date: 05/09/2019 Your Surgeon: [name]  Please call Same Day Surgery at [phone] between the hours of 1pm-3pm one day prior to your surgery. They will inform you of the time to arrive at Same Day Surgery which is located on the second floor of the [facility].  A COVID-19 test will be required prior to surgery & remain in quarantine until the day of surgery to minimize risk of infection.  Please refer to the attached letter regarding instructions for Pre-Admission Testing. You will receive a call from the [place] office regarding your appointment with them.  The Pre-Admission Testing office is located at [place], on the first floor of the [place] [facility] in [place] (office is to the right as you enter through the [employer] of the [facility]). Please have all medications you are currently taking and your insurance card available.   Patient was advised to have nothing to eat or drink after midnight the night prior to surgery except that he may have only water until 2 hours before surgery with nothing to drink within 2 hours of surgery.  The patient currently takes aspirin and was advised to continue medication prior to  surgery per Dr Erlene Quan. Patient's questions were answered and he expressed understanding of these instructions.

## 2019-04-28 ENCOUNTER — Telehealth: Payer: Self-pay | Admitting: Radiology

## 2019-04-28 ENCOUNTER — Other Ambulatory Visit: Payer: Self-pay | Admitting: Radiology

## 2019-04-28 DIAGNOSIS — N200 Calculus of kidney: Secondary | ICD-10-CM

## 2019-04-28 LAB — URINE CULTURE

## 2019-04-28 MED ORDER — AMPICILLIN 500 MG PO CAPS: 500.0000 mg | ORAL_CAPSULE | Freq: Two times a day (BID) | ORAL | 0 refills | Status: DC

## 2019-04-28 NOTE — Telephone Encounter (Signed)
Notified patient of script sent to pharmacy. Advised patient to stop taking Cipro. Questions answered. Patient expresses understanding of instructions.

## 2019-04-28 NOTE — Progress Notes (Signed)
ED Antimicrobial Stewardship Positive Culture Follow Up   Javier Watson is an 73 y.o. male who presented to [facility] on 04/25/2019 with a chief complaint of  Chief Complaint  Patient presents with  . Hematuria    Recent Results (from the past 720 hour(s))  Urine culture     Status: Abnormal   Collection Time: 04/25/19  1:35 PM   Specimen: Urine, Clean Catch  Result Value Ref Range Status   Specimen Description   Final    URINE, CLEAN CATCH Performed at [facility], [address], [place], [place] [address]    Special Requests   Final    Normal Performed at [facility], [place], [place] [address]    Culture 20,000 COLONIES/mL ENTEROCOCCUS FAECALIS (A)  Final   Report Status 04/27/2019 FINAL  Final   Organism ID, Bacteria ENTEROCOCCUS FAECALIS (A)  Final      Susceptibility   Enterococcus faecalis - MIC*    AMPICILLIN <=2 SENSITIVE Sensitive     LEVOFLOXACIN >=8 RESISTANT Resistant     NITROFURANTOIN <=16 SENSITIVE Sensitive     VANCOMYCIN 1 SENSITIVE Sensitive     * 20,000 COLONIES/mL ENTEROCOCCUS FAECALIS  Urine culture     Status: None   Collection Time: 04/26/19  3:32 PM   Specimen: Urine   UR  Result Value Ref Range Status   Urine Culture, Routine Final report  Final   Organism ID, Bacteria Comment  Final    Comment: Culture shows less than 10,000 colony forming units of bacteria per milliliter of urine. This colony count is not generally considered to be clinically significant.   Microscopic Examination     Status: None   Collection Time: 04/26/19  3:52 PM   URINE  Result Value Ref Range Status   WBC, UA 0-5 0 - 5 /hpf Final   RBC None seen 0 - 2 /hpf Final   Epithelial Cells (non renal) 0-10 0 - 10 /hpf Final   Bacteria, UA None seen None seen/Few Final    [x]  Treated with ciprofloxacin, organism resistant to prescribed antimicrobial  This is a patient of Dr Cherrie Gauze. He is seeing her for an outpatient urological  procedure. I spoke with Amy at her office about the above culture. Dr Erlene Quan is aware of this culture and took a follow-up urine culture on 6/9, which has resulted as negative. No further treatment is required. I have given Amy my telephone number where I can be reached if there are any further questions.  Dallie Piles, PharmD 04/28/2019, 2:18 PM Clinical Pharmacist Monday - Friday phone -  [phone] Saturday - Sunday phone - [phone]

## 2019-04-28 NOTE — Telephone Encounter (Signed)
-----   Message from [name], MD sent at 04/28/2019  3:57 PM EDT ----- As a precaution, will change his antibiotics to ampicillin 500 mg bid x 7 days  [name], MD ----- Message ----- From: Ranell Patrick, RN Sent: 04/28/2019   1:31 PM EDT To: [name], MD  The pharmacy called about the urine culture done in the ER being resistant to the medication he was given while there. A urine culture was repeated the following day in the office that showed <10,000 colonies. Does he need a different script to be sent in before surgery?

## 2019-05-05 ENCOUNTER — Other Ambulatory Visit: Payer: Self-pay

## 2019-05-05 ENCOUNTER — Encounter
Admission: RE | Admit: 2019-05-05 | Discharge: 2019-05-05 | Disposition: A | Discharge status: Home / Self Care | Payer: 59 | Source: Ambulatory Visit | Attending: Urology | Admitting: Urology

## 2019-05-05 DIAGNOSIS — R9431 Abnormal electrocardiogram [ECG] [EKG]: Secondary | ICD-10-CM | POA: Diagnosis not present

## 2019-05-05 DIAGNOSIS — Z01818 Encounter for other preprocedural examination: Secondary | ICD-10-CM | POA: Insufficient documentation

## 2019-05-05 DIAGNOSIS — I440 Atrioventricular block, first degree: Secondary | ICD-10-CM | POA: Insufficient documentation

## 2019-05-05 DIAGNOSIS — I10 Essential (primary) hypertension: Secondary | ICD-10-CM | POA: Insufficient documentation

## 2019-05-05 DIAGNOSIS — Z1159 Encounter for screening for other viral diseases: Secondary | ICD-10-CM | POA: Insufficient documentation

## 2019-05-05 HISTORY — DX: Personal history of urinary calculi: Z87.442

## 2019-05-05 HISTORY — DX: Cardiac arrhythmia, unspecified: I49.9

## 2019-05-05 LAB — POTASSIUM: Potassium: 3.2 mmol/L — ABNORMAL LOW (ref 3.5–5.1)

## 2019-05-05 NOTE — Patient Instructions (Signed)
Your procedure is scheduled on: 05-09-19 MONDAY Report to Same Day Surgery 2nd floor medical mall [facility] Entrance-take elevator on left to 2nd floor.  Check in with surgery information desk.) To find out your arrival time please call [phone] between 1PM - 3PM on 05-06-19 FRIDAY  Remember: Instructions that are not followed completely may result in serious medical risk, up to and including death, or upon the discretion of your surgeon and anesthesiologist your surgery may need to be rescheduled.    _x___ 1. Do not eat food after midnight the night before your procedure. NO GUM OR CANDY AFTER MIDNIGHT. You may drink clear liquids up to 2 hours before you are scheduled to arrive at the hospital for your procedure.  Do not drink clear liquids within 2 hours of your scheduled arrival to the hospital.  Clear liquids include  --Water or Apple juice without pulp  --Clear carbohydrate beverage such as ClearFast or Gatorade  --Black Coffee or Clear Tea (No milk, no creamers, do not add anything to the coffee or Tea   ____Ensure clear carbohydrate drink on the way to the hospital for bariatric patients  ____Ensure clear carbohydrate drink 3 hours before surgery for Dr Dwyane Luo patients if physician instructed.      __x__ 2. No Alcohol for 24 hours before or after surgery.   __x__3. No Smoking or e-cigarettes for 24 prior to surgery.  Do not use any chewable tobacco products for at least 6 hour prior to surgery   ____  4. Bring all medications with you on the day of surgery if instructed.    __x__ 5. Notify your doctor if there is any change in your medical condition     (cold, fever, infections).    x___6. On the morning of surgery brush your teeth with toothpaste and water.  You may rinse your mouth with mouth wash if you wish.  Do not swallow any toothpaste or mouthwash.   Do not wear jewelry, make-up, hairpins, clips or nail polish.  Do not wear lotions, powders, or perfumes. You  may wear deodorant.  Do not shave 48 hours prior to surgery. Men may shave face and neck.  Do not bring valuables to the hospital.    [facility] is not responsible for any belongings or valuables.               Contacts, dentures or bridgework may not be worn into surgery.  Leave your suitcase in the car. After surgery it may be brought to your room.  For patients admitted to the hospital, discharge time is determined by your treatment team.  _  Patients discharged the day of surgery will not be allowed to drive home.  You will need someone to drive you home and stay with you the night of your procedure.    Please read over the following fact sheets that you were given:   [facility] Preparing for Surgery  _x___ TAKE THE FOLLOWING MEDICATION THE MORNING OF SURGERY WITH A SMALL SIP OF WATER. These include:  1. WELLBUTRIN (BUPROPION)  2. CELEXA (CITALOPRAM)  3. DILTIAZEM (TIAZAC)  4. HYDRALAZINE (APRESOLINE)  5. FLOMAX (TAMSULOSIN)  6. YOU MAY TAKE OXYCODONE DAY OF SURGERY IF NEEDED WITH A SMALL SIP OF WATER  ____Fleets enema or Magnesium Citrate as directed.   _x___ Use CHG Soap or sage wipes as directed on instruction sheet   ____ Use inhalers on the day of surgery and bring to hospital day of surgery  ____ Stop Metformin and Janumet 2 days prior to surgery.    ____ Take 1/2 of usual insulin dose the night before surgery and none on the morning surgery.   ____ Follow recommendations from Cardiologist, Pulmonologist or PCP regarding stopping Aspirin, Coumadin, Plavix ,Eliquis, Effient, or Pradaxa, and Pletal.  X____Stop Anti-inflammatories such as Advil, Aleve, Ibuprofen, Motrin, Naproxen, Naprosyn, Goodies powders or aspirin products. OK to take Tylenol OR OXYCODONE IF NEEDED   _x___ Stop supplements until after surgery-STOP FISH OIL NOW-MAY RESUME AFTER YOUR SURGERY   _X___ Bring C-Pap to the hospital.

## 2019-05-06 LAB — NOVEL CORONAVIRUS, NAA (HOSP ORDER, SEND-OUT TO REF LAB; TAT 18-24 HRS): SARS-CoV-2, NAA: NOT DETECTED

## 2019-05-08 MED ORDER — CEFAZOLIN SODIUM-DEXTROSE 2-4 GM/100ML-% IV SOLN
2.0000 g | INTRAVENOUS | Status: AC
  Administered 2019-05-09: 2 g via INTRAVENOUS

## 2019-05-09 ENCOUNTER — Encounter: Payer: Self-pay | Admitting: *Deleted

## 2019-05-09 ENCOUNTER — Ambulatory Visit: Payer: 59 | Admitting: Anesthesiology

## 2019-05-09 ENCOUNTER — Ambulatory Visit: Payer: 59

## 2019-05-09 ENCOUNTER — Ambulatory Visit
Admission: RE | Admit: 2019-05-09 | Discharge: 2019-05-09 | Disposition: A | Discharge status: Home / Self Care | Payer: 59 | Source: Ambulatory Visit | Attending: Urology | Admitting: Urology

## 2019-05-09 ENCOUNTER — Encounter
Admission: RE | Disposition: A | Discharge status: Home / Self Care | Payer: Self-pay | Source: Ambulatory Visit | Attending: Urology

## 2019-05-09 ENCOUNTER — Other Ambulatory Visit: Payer: Self-pay

## 2019-05-09 DIAGNOSIS — N132 Hydronephrosis with renal and ureteral calculous obstruction: Secondary | ICD-10-CM | POA: Insufficient documentation

## 2019-05-09 DIAGNOSIS — F329 Major depressive disorder, single episode, unspecified: Secondary | ICD-10-CM | POA: Insufficient documentation

## 2019-05-09 DIAGNOSIS — G4730 Sleep apnea, unspecified: Secondary | ICD-10-CM | POA: Diagnosis not present

## 2019-05-09 DIAGNOSIS — M1990 Unspecified osteoarthritis, unspecified site: Secondary | ICD-10-CM | POA: Insufficient documentation

## 2019-05-09 DIAGNOSIS — Z7982 Long term (current) use of aspirin: Secondary | ICD-10-CM | POA: Insufficient documentation

## 2019-05-09 DIAGNOSIS — N200 Calculus of kidney: Secondary | ICD-10-CM

## 2019-05-09 DIAGNOSIS — I129 Hypertensive chronic kidney disease with stage 1 through stage 4 chronic kidney disease, or unspecified chronic kidney disease: Secondary | ICD-10-CM | POA: Diagnosis not present

## 2019-05-09 DIAGNOSIS — N135 Crossing vessel and stricture of ureter without hydronephrosis: Secondary | ICD-10-CM

## 2019-05-09 DIAGNOSIS — N131 Hydronephrosis with ureteral stricture, not elsewhere classified: Secondary | ICD-10-CM | POA: Diagnosis not present

## 2019-05-09 DIAGNOSIS — Z87442 Personal history of urinary calculi: Secondary | ICD-10-CM | POA: Insufficient documentation

## 2019-05-09 DIAGNOSIS — E1122 Type 2 diabetes mellitus with diabetic chronic kidney disease: Secondary | ICD-10-CM | POA: Insufficient documentation

## 2019-05-09 DIAGNOSIS — F419 Anxiety disorder, unspecified: Secondary | ICD-10-CM | POA: Insufficient documentation

## 2019-05-09 DIAGNOSIS — I252 Old myocardial infarction: Secondary | ICD-10-CM | POA: Diagnosis not present

## 2019-05-09 DIAGNOSIS — F4310 Post-traumatic stress disorder, unspecified: Secondary | ICD-10-CM | POA: Diagnosis not present

## 2019-05-09 DIAGNOSIS — Z79899 Other long term (current) drug therapy: Secondary | ICD-10-CM | POA: Insufficient documentation

## 2019-05-09 DIAGNOSIS — N183 Chronic kidney disease, stage 3 (moderate): Secondary | ICD-10-CM | POA: Diagnosis not present

## 2019-05-09 DIAGNOSIS — G8921 Chronic pain due to trauma: Secondary | ICD-10-CM | POA: Insufficient documentation

## 2019-05-09 HISTORY — PX: CYSTOSCOPY/URETEROSCOPY/HOLMIUM LASER/STENT PLACEMENT: SHX6546

## 2019-05-09 LAB — POCT I-STAT 4, (NA,K, GLUC, HGB,HCT)
Glucose, Bld: 111 mg/dL — ABNORMAL HIGH (ref 70–99)
HCT: 42 % (ref 39.0–52.0)
Hemoglobin: 14.3 g/dL (ref 13.0–17.0)
Potassium: 3.1 mmol/L — ABNORMAL LOW (ref 3.5–5.1)
Sodium: 138 mmol/L (ref 135–145)

## 2019-05-09 LAB — GLUCOSE, CAPILLARY: Glucose-Capillary: 110 mg/dL — ABNORMAL HIGH (ref 70–99)

## 2019-05-09 SURGERY — CYSTOSCOPY/URETEROSCOPY/HOLMIUM LASER/STENT PLACEMENT
Anesthesia: General | Laterality: Right

## 2019-05-09 MED ORDER — OXYBUTYNIN CHLORIDE 5 MG PO TABS: 5.0000 mg | ORAL_TABLET | Freq: Three times a day (TID) | ORAL | 0 refills | Status: DC | PRN

## 2019-05-09 MED ORDER — HYDROCODONE-ACETAMINOPHEN 5-325 MG PO TABS: 1.0000 | ORAL_TABLET | Freq: Four times a day (QID) | ORAL | 0 refills | Status: DC | PRN

## 2019-05-09 MED ORDER — LIDOCAINE HCL (CARDIAC) PF 100 MG/5ML IV SOSY
PREFILLED_SYRINGE | INTRAVENOUS | Status: DC | PRN
  Administered 2019-05-09: 100 mg via INTRAVENOUS

## 2019-05-09 MED ORDER — ONDANSETRON HCL 4 MG/2ML IJ SOLN
INTRAMUSCULAR | Status: DC | PRN
  Administered 2019-05-09: 4 mg via INTRAVENOUS

## 2019-05-09 MED ORDER — IOHEXOL 180 MG/ML  SOLN
INTRAMUSCULAR | Status: DC | PRN
  Administered 2019-05-09: 10:00:00 40 mL

## 2019-05-09 MED ORDER — FAMOTIDINE 20 MG PO TABS
20.0000 mg | ORAL_TABLET | Freq: Once | ORAL | Status: AC
  Administered 2019-05-09: 20 mg via ORAL

## 2019-05-09 MED ORDER — FAMOTIDINE 20 MG PO TABS
ORAL_TABLET | ORAL | Status: AC
  Filled 2019-05-09: qty 1

## 2019-05-09 MED ORDER — DEXAMETHASONE SODIUM PHOSPHATE 10 MG/ML IJ SOLN
INTRAMUSCULAR | Status: DC | PRN
  Administered 2019-05-09: 10 mg via INTRAVENOUS

## 2019-05-09 MED ORDER — OXYCODONE HCL 5 MG PO TABS: 5.0000 mg | ORAL_TABLET | Freq: Once | ORAL | Status: DC | PRN

## 2019-05-09 MED ORDER — MIDAZOLAM HCL 2 MG/2ML IJ SOLN
INTRAMUSCULAR | Status: DC | PRN
  Administered 2019-05-09: 1 mg via INTRAVENOUS

## 2019-05-09 MED ORDER — TAMSULOSIN HCL 0.4 MG PO CAPS: 0.4000 mg | ORAL_CAPSULE | Freq: Every day | ORAL | 0 refills | Status: DC

## 2019-05-09 MED ORDER — PROPOFOL 10 MG/ML IV BOLUS
INTRAVENOUS | Status: DC | PRN
  Administered 2019-05-09: 50 mg via INTRAVENOUS
  Administered 2019-05-09: 150 mg via INTRAVENOUS

## 2019-05-09 MED ORDER — PROMETHAZINE HCL 25 MG/ML IJ SOLN: 6.2500 mg | INTRAMUSCULAR | Status: DC | PRN

## 2019-05-09 MED ORDER — OXYCODONE HCL 5 MG/5ML PO SOLN: 5.0000 mg | Freq: Once | ORAL | Status: DC | PRN

## 2019-05-09 MED ORDER — MEPERIDINE HCL 50 MG/ML IJ SOLN: 6.2500 mg | INTRAMUSCULAR | Status: DC | PRN

## 2019-05-09 MED ORDER — LACTATED RINGERS IV SOLN
INTRAVENOUS | Status: DC
  Administered 2019-05-09: 08:00:00 via INTRAVENOUS

## 2019-05-09 MED ORDER — EPHEDRINE SULFATE 50 MG/ML IJ SOLN
INTRAMUSCULAR | Status: DC | PRN
  Administered 2019-05-09: 10 mg via INTRAVENOUS

## 2019-05-09 MED ORDER — FENTANYL CITRATE (PF) 100 MCG/2ML IJ SOLN
INTRAMUSCULAR | Status: DC | PRN
  Administered 2019-05-09: 50 ug via INTRAVENOUS

## 2019-05-09 MED ORDER — CEFAZOLIN SODIUM-DEXTROSE 2-4 GM/100ML-% IV SOLN
INTRAVENOUS | Status: AC
  Filled 2019-05-09: qty 100

## 2019-05-09 MED ORDER — FENTANYL CITRATE (PF) 100 MCG/2ML IJ SOLN: 25.0000 ug | INTRAMUSCULAR | Status: DC | PRN

## 2019-05-09 SURGICAL SUPPLY — 33 items
BAG DRAIN CYSTO-URO LG1000N (MISCELLANEOUS) ×2 IMPLANT
BASKET ZERO TIP 1.9FR (BASKET) IMPLANT
BRUSH SCRUB EZ 1% IODOPHOR (MISCELLANEOUS) ×2 IMPLANT
BSKT STON RTRVL ZERO TP 1.9FR (BASKET)
CATH BEACON 5 .035 65 KMP TIP (CATHETERS) ×1 IMPLANT
CATH URETL 5X70 OPEN END (CATHETERS) ×2 IMPLANT
CNTNR SPEC 2.5X3XGRAD LEK (MISCELLANEOUS)
CONT SPEC 4OZ STER OR WHT (MISCELLANEOUS)
CONT SPEC 4OZ STRL OR WHT (MISCELLANEOUS)
CONTAINER SPEC 2.5X3XGRAD LEK (MISCELLANEOUS) IMPLANT
DRAPE UTILITY 15X26 TOWEL STRL (DRAPES) ×2 IMPLANT
FIBER LASER LITHO 273 (Laser) IMPLANT
GLIDEWIRE STIFF .35X180X3 HYDR (WIRE) ×1 IMPLANT
GLOVE BIO SURGEON STRL SZ 6.5 (GLOVE) ×2 IMPLANT
GOWN STRL REUS W/ TWL LRG LVL3 (GOWN DISPOSABLE) ×2 IMPLANT
GOWN STRL REUS W/TWL LRG LVL3 (GOWN DISPOSABLE) ×4
GUIDEWIRE GREEN .038 145CM (MISCELLANEOUS) IMPLANT
GUIDEWIRE STR DUAL SENSOR (WIRE) ×2 IMPLANT
INFUSOR MANOMETER BAG 3000ML (MISCELLANEOUS) ×2 IMPLANT
INTRODUCER DILATOR DOUBLE (INTRODUCER) IMPLANT
KIT TURNOVER CYSTO (KITS) ×2 IMPLANT
PACK CYSTO AR (MISCELLANEOUS) ×2 IMPLANT
SET CYSTO W/LG BORE CLAMP LF (SET/KITS/TRAYS/PACK) ×2 IMPLANT
SHEATH URETERAL 12FRX35CM (MISCELLANEOUS) IMPLANT
SOL .9 NS 3000ML IRR  AL (IV SOLUTION) ×1
SOL .9 NS 3000ML IRR AL (IV SOLUTION) ×1
SOL .9 NS 3000ML IRR UROMATIC (IV SOLUTION) ×1 IMPLANT
STENT URET 6FRX24 CONTOUR (STENTS) IMPLANT
STENT URET 6FRX26 CONTOUR (STENTS) IMPLANT
STENT URO INLAY 6FRX26CM (STENTS) ×1 IMPLANT
SURGILUBE 2OZ TUBE FLIPTOP (MISCELLANEOUS) ×2 IMPLANT
SYR 10ML LL (SYRINGE) ×1 IMPLANT
WATER STERILE IRR 1000ML POUR (IV SOLUTION) ×2 IMPLANT

## 2019-05-09 NOTE — Anesthesia Postprocedure Evaluation (Signed)
Anesthesia Post Note  Patient: Javier Watson  Procedure(s) Performed: CYSTOSCOPY/URETEROSCOPY/STENT PLACEMENT (Right )  Patient location during evaluation: PACU Anesthesia Type: General Level of consciousness: awake and alert and oriented Pain management: pain level controlled Vital Signs Assessment: post-procedure vital signs reviewed and stable Respiratory status: spontaneous breathing, nonlabored ventilation and respiratory function stable Cardiovascular status: blood pressure returned to baseline and stable Postop Assessment: no signs of nausea or vomiting Anesthetic complications: no     Last Vitals:  Vitals:   05/09/19 1052 05/09/19 1122  BP: (!) 147/69 (!) 143/62  Pulse: 77 71  Resp: 18 18  Temp: 36.7 C   SpO2: 96% 98%    Last Pain:  Vitals:   05/09/19 1122  TempSrc:   PainSc: 0-No pain                 Veneda Kirksey

## 2019-05-09 NOTE — Interval H&P Note (Signed)
History and Physical Interval Note:  05/09/2019 8:11 AM  Javier Watson  has presented today for surgery, with the diagnosis of right ureteral calculus.  The various methods of treatment have been discussed with the patient and family. After consideration of risks, benefits and other options for treatment, the patient has consented to  Procedure(s): CYSTOSCOPY/URETEROSCOPY/HOLMIUM LASER/STENT PLACEMENT (Right) as a surgical intervention.  The patient's history has been reviewed, patient examined, no change in status, stable for surgery.  I have reviewed the patient's chart and labs.  Questions were answered to the patient's satisfaction.     [name]

## 2019-05-09 NOTE — Discharge Instructions (Addendum)
You have a ureteral stent in place.  This is a tube that extends from your kidney to your bladder.  This may cause urinary bleeding, burning with urination, and urinary frequency.  Please call our office or present to the ED if you develop fevers >101 or pain which is not able to be controlled with oral pain medications.  You may be given either Flomax and/ or ditropan to help with bladder spasms and stent pain in addition to pain medications.   ° °[facility] °[address], [address] °[place], [place] [address] °[phone] ° ° ° °AMBULATORY SURGERY  °DISCHARGE INSTRUCTIONS ° ° °1) The drugs that you were given will stay in your system until tomorrow so for the next 24 hours you should not: ° °A) Drive an automobile °B) Make any legal decisions °C) Drink any alcoholic beverage ° ° °2) You may resume regular meals tomorrow.  Today it is better to start with liquids and gradually work up to solid foods. ° °You may eat anything you prefer, but it is better to start with liquids, then soup and crackers, and gradually work up to solid foods. ° ° °3) Please notify your doctor immediately if you have any unusual bleeding, trouble breathing, redness and pain at the surgery site, drainage, fever, or pain not relieved by medication. ° ° ° °4) Additional Instructions: ° ° ° ° ° ° ° °Please contact your physician with any problems or Same Day Surgery at [phone], Monday through Friday 6 am to 4 pm, or [place] [facility] [place] Main number at [phone]. °

## 2019-05-09 NOTE — Op Note (Signed)
Date of procedure: 05/09/19  Preoperative diagnosis:  1. Right hydronephrosis 2. Right ureteral calculus  Postoperative diagnosis:  1. Severe right hydroureteronephrosis 2. Right distal ureteral stricture  Procedure: 1. Cystoscopy 2. Right retrograde pyelogram 3. Diagnostic right ureteroscopy 4. Right ureteral stent placement  Surgeon: [name], MD  Anesthesia: General  Complications: None  Intraoperative findings: Massively dilated right ureter down to the level of the distal ureter with an abrupt transition, able to pass ureteroscope without difficulty.  No stone identified.  Stent placed.  EBL: Minimal  Specimens: None  Drains: 6 x 26 French Bard optima ureteral stent  Indication: Javier Watson is a 73 y.o. patient with multiple GU issues primarily followed at the [place] including history of prostate cancer, right ureteral stricture and ejaculatory dysfunction who presented with a 8 mm right distal ureteral calculus with proximal hydroureteronephrosis.  After reviewing the management options for treatment, he elected to proceed with the above surgical procedure(s). We have discussed the potential benefits and risks of the procedure, side effects of the proposed treatment, the likelihood of the patient achieving the goals of the procedure, and any potential problems that might occur during the procedure or recuperation. Informed consent has been obtained.  Description of procedure:  The patient was taken to the operating room and general anesthesia was induced.  The patient was placed in the dorsal lithotomy position, prepped and draped in the usual sterile fashion, and preoperative antibiotics were administered. A preoperative time-out was performed.   64 French cystoscope was advanced per urethra into the bladder.  Notably, the prostate was massively enlarged with mass-effect at the base of the bladder and elevated bladder neck.  This distorted the trigone making access to  the right distal ureter somewhat difficult.  I was ultimately able to find the right UO but with some difficulty.  Attempted to cannulated using a 5 [place] open-ended ureteral catheter and a sensor wire unsuccessfully due to J hooking in the anatomy.  Ultimately, I was successful using a Kumpe catheter with an angled tip as well as a angled Glidewire cannulating the distal ureter.  I advanced the Kumpe and performed a retrograde pyelogram within the distal ureter which showed severe hydroureter down to transition point within the distal ureter where there was some beaking but just proximal to the level of the UVJ.  There are some mild hydronephrosis also appreciated.  Ultimately, I was able to advance the Kumpe catheter up to level the proximal ureter and switched out to a sensor wire which was snapped in place as a safety wire.  The Kumpe catheter and cystoscope were removed.  A 4.5 semirigid ureteroscope was then brought in and with some mild difficulty due to the angle, I was eventually able to advance the scope through the UO to the level of the transition point which was easily passable.  There is no stone identified at this location.  I was able to scope all the way up to the level of the proximal ureter within the massively dilated system but no obvious tumor, stones, or lesions were identified.  The scope was readvanced to the transition point again where no obvious pathology could be identified.  The scope was then ultimately removed.  Notably, the bladder was also noted to be mildly trabeculated but without stones masses or lesions.  The safety wires and backloaded over rigid cystoscope.  A 6 x 26 French double-J ureteral stent, Bard optima was advanced with no string up to level the upper  pole calyx.  The wire was partially drawn till focal is noted in the renal pelvis as well as within the bladder.  Notably, due to the very large prostate, the distal coil appeared to be superiorly displaced however was  in good position within the bladder cystoscopically.  Bladder was then drained.  The patient was then clean and dry, repositioned in supine position, version anesthesia, taken to PACU in stable condition.  Plan: We do have some partial previous records in the [place] which does indicate that he had a ureteral stricture at this level in the past dating back to 2017.  His stent was ultimately removed secondary to infection.  We will have him return to the office discuss how to manage this obstructed system.  In addition, it appears that he has a history of prostate cancer which is previously followed at the [place].  If he is transitioning his care to [facility], will need to follow-up on his prostate cancer history as well.  [name], M.D.

## 2019-05-09 NOTE — Anesthesia Preprocedure Evaluation (Signed)
Anesthesia Evaluation  Patient identified by MRN, date of birth, ID band Patient awake    Reviewed: Allergy & Precautions, NPO status , Patient's Chart, lab work & pertinent test results  History of Anesthesia Complications Negative for: history of anesthetic complications  Airway Mallampati: III  TM Distance: >3 FB Neck ROM: Full    Dental  (+) Poor Dentition, Caps   Pulmonary sleep apnea and Continuous Positive Airway Pressure Ventilation , neg COPD,    breath sounds clear to auscultation- rhonchi (-) wheezing      Cardiovascular hypertension, + CAD and + Past MI  (-) Cardiac Stents and (-) CABG  Rhythm:Regular Rate:Normal - Systolic murmurs and - Diastolic murmurs    Neuro/Psych neg Seizures PSYCHIATRIC DISORDERS Anxiety Depression negative neurological ROS     GI/Hepatic GERD  ,  Endo/Other  diabetes  Renal/GU Renal InsufficiencyRenal disease (nephrolithiasis)     Musculoskeletal  (+) Arthritis ,   Abdominal (+) + obese,   Peds  Hematology negative hematology ROS (+)   Anesthesia Other Findings Past Medical History: No date: Anxiety No date: Arthritis No date: Cancer ([place])     Comment:  PROSTATE No date: Chronic kidney disease     Comment:  STAGE 3 No date: Chronic pain     Comment:  secondary to trauma No date: Depression No date: Diabetes mellitus without complication ([place])     Comment:  patient follows diet only. no medications No date: Dysrhythmia     Comment:  irregular heart beat No date: Essential hypertension No date: History of kidney stones No date: Keratoderma No date: Post traumatic stress disorder (PTSD) No date: Sleep apnea     Comment:  C-PAP   Reproductive/Obstetrics                             Anesthesia Physical Anesthesia Plan  ASA: III  Anesthesia Plan: General   Post-op Pain Management:    Induction: Intravenous  PONV Risk Score and Plan: 1  and Ondansetron  Airway Management Planned: Oral ETT  Additional Equipment:   Intra-op Plan:   Post-operative Plan: Extubation in OR  Informed Consent: I have reviewed the patients History and Physical, chart, labs and discussed the procedure including the risks, benefits and alternatives for the proposed anesthesia with the patient or authorized representative who has indicated his/her understanding and acceptance.     Dental advisory given  Plan Discussed with: CRNA and Anesthesiologist  Anesthesia Plan Comments:         Anesthesia Quick Evaluation

## 2019-05-09 NOTE — Anesthesia Post-op Follow-up Note (Signed)
Anesthesia QCDR form completed.        

## 2019-05-09 NOTE — Transfer of Care (Signed)
Immediate Anesthesia Transfer of Care Note  Patient: Javier Watson  Procedure(s) Performed: CYSTOSCOPY/URETEROSCOPY/STENT PLACEMENT (Right )  Patient Location: PACU  Anesthesia Type:General  Level of Consciousness: awake and oriented  Airway & Oxygen Therapy: Patient Spontanous Breathing and Patient connected to face mask oxygen  Post-op Assessment: Report given to RN and Post -op Vital signs reviewed and stable  Post vital signs: Reviewed and stable  Last Vitals:  Vitals Value Taken Time  BP 160/82 05/09/19 1006  Temp    Pulse 84 05/09/19 1007  Resp 9 05/09/19 1007  SpO2 98 % 05/09/19 1007  Vitals shown include unvalidated device data.  Last Pain:  Vitals:   05/09/19 0748  TempSrc: Temporal  PainSc: 0-No pain      Patients Stated Pain Goal: 0 (00/76/22 6333)  Complications: No apparent anesthesia complications

## 2019-05-09 NOTE — Anesthesia Procedure Notes (Signed)
Procedure Name: LMA Insertion Date/Time: 05/09/2019 9:03 AM Performed by: Philbert Riser, CRNA Pre-anesthesia Checklist: Patient identified, Emergency Drugs available, Suction available, Patient being monitored and Timeout performed Oxygen Delivery Method: Circle system utilized Preoxygenation: Pre-oxygenation with 100% oxygen Induction Type: IV induction Ventilation: Mask ventilation without difficulty LMA Size: 4.0

## 2019-05-17 ENCOUNTER — Ambulatory Visit (INDEPENDENT_AMBULATORY_CARE_PROVIDER_SITE_OTHER): Payer: 59 | Admitting: Urology

## 2019-05-17 ENCOUNTER — Encounter: Payer: Self-pay | Admitting: Urology

## 2019-05-17 ENCOUNTER — Other Ambulatory Visit: Payer: Self-pay

## 2019-05-17 VITALS — BP 125/68 | HR 69 | Ht 68.0 in | Wt 230.0 lb

## 2019-05-17 DIAGNOSIS — N135 Crossing vessel and stricture of ureter without hydronephrosis: Secondary | ICD-10-CM

## 2019-05-17 DIAGNOSIS — N183 Chronic kidney disease, stage 3 unspecified: Secondary | ICD-10-CM

## 2019-05-17 DIAGNOSIS — N201 Calculus of ureter: Secondary | ICD-10-CM

## 2019-05-17 DIAGNOSIS — N400 Enlarged prostate without lower urinary tract symptoms: Secondary | ICD-10-CM | POA: Diagnosis not present

## 2019-05-17 DIAGNOSIS — C61 Malignant neoplasm of prostate: Secondary | ICD-10-CM

## 2019-05-18 LAB — URINALYSIS, COMPLETE
Bilirubin, UA: NEGATIVE
Glucose, UA: NEGATIVE
Ketones, UA: NEGATIVE
Nitrite, UA: NEGATIVE
Specific Gravity, UA: 1.015 (ref 1.005–1.030)
Urobilinogen, Ur: 0.2 mg/dL (ref 0.2–1.0)
pH, UA: 5.5 (ref 5.0–7.5)

## 2019-05-18 LAB — MICROSCOPIC EXAMINATION: RBC, Urine: >30 /hpf — AB (ref 0–2)

## 2019-05-19 ENCOUNTER — Other Ambulatory Visit: Payer: 59

## 2019-05-19 ENCOUNTER — Other Ambulatory Visit: Payer: Self-pay

## 2019-05-19 DIAGNOSIS — N201 Calculus of ureter: Secondary | ICD-10-CM

## 2019-05-19 NOTE — Progress Notes (Signed)
05/17/2019 12:26 PM   Javier Watson [birth year] [id number]  Referring provider: No referring provider defined for this encounter.  Chief Complaint  Patient presents with  . Follow-up    HPI: 73 year old male who presents today right diagnostic ureteroscopy/retrograde pyelogram/stent placement on 05/09/2019.  Patient initially presented to the emergency room found to have an 8 mm right distal ureteral calculus.  He was taken to the operating room on 05/07/2019 for treatment of this, however was found to have a high-grade right distal ureteral obstruction.  The stone was not identified.  Please see retrograde pyelogram for details.  The ureteral stent was placed and he presents today to discuss management of his ureteral stricture.  He reports that he underwent evaluation/stent placement for this back in 2017 at the [place].  Etiology of the stricture is unclear and I do not have records for this.  He reports that the stent became infected and was ultimately removed.  He said no further treatment for the stricture.  In the interim over the past several years, his creatinine has stable aroun dhis baseline ~1.5, GFR 45.  This is up from Cr 1.1 on 2011.  In addition to this, he may possibly have a history of prostate cancer.  Again, I do not have documentation of this or recent PSA.  He reports that he all of his records from the [place], very large stack and was willing to bring them in next visit.  He never received any treatment for his prostate cancer.  He was told that his prostate was enlarged and a small amount of possibly cancer that did not require treatment.  On CT scan, he does have on CT scan as well as cystoscopically, he does have a very large prostate.  He has very little to no urinary symptoms related to this.  He has been tolerating the stent well.  He has some mild flank pain with voiding only but otherwise no urgency frequency.  His resolved.   PMH: Past Medical History:  Diagnosis  Date  . Anxiety   . Arthritis   . Cancer ([place])    PROSTATE  . Chronic kidney disease    STAGE 3  . 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 Depression   . Diabetes mellitus without complication ([place])    patient follows diet only. no medications  . Dysrhythmia    irregular heart beat  . Essential hypertension   . History of kidney stones   . Keratoderma   . Post traumatic stress disorder (PTSD)   . Sleep apnea    C-PAP    Surgical History: Past Surgical History:  Procedure Laterality Date  . Arm Surgery    . BACK SURGERY  1967   Laminectomy on skull of neck  . COLONOSCOPY    . CRANIOTOMY  1967  . CYSTOSCOPY WITH URETEROSCOPY AND STENT PLACEMENT Right 01/2016  . CYSTOSCOPY/URETEROSCOPY/HOLMIUM LASER/STENT PLACEMENT Right 05/09/2019   Procedure: CYSTOSCOPY/URETEROSCOPY/STENT PLACEMENT;  Surgeon: [name], MD;  Location: [facility] OR[facility];  Service: Urology;  Laterality: Right;  . HYDROCELE EXCISION Right 10/21/2016   Procedure: HYDROCELECTOMY ADULT;  Surgeon: [name], MD;  Location: [facility] OR[facility];  Service: Urology;  Laterality: Right;  . ORTHOPEDIC SURGERY    . TOTAL HIP ARTHROPLASTY Left 2006  . URETEROSCOPY Right 04/2016   stent removal- spesis    Home Medications:  Allergies as of 05/17/2019   No Known Allergies     Medication List  Accurate as of May 17, 2019 11:59 PM. If you have any questions, ask your nurse or doctor.        ampicillin 500 MG capsule Commonly known as: PRINCIPEN Take 1 capsule (500 mg total) by mouth 2 (two) times daily.   aspirin 81 MG chewable tablet Chew 1 tablet (81 mg total) by mouth daily.   buPROPion 100 MG 12 hr tablet Commonly known as: WELLBUTRIN SR Take 200 mg by mouth 2 (two) times daily.   citalopram 40 MG tablet Commonly known as: CELEXA Take 40 mg by mouth every morning.   diltiazem 360 MG 24 hr capsule Commonly known as: TIAZAC Take 360 mg by mouth every morning.   docusate sodium 100 MG capsule Commonly  known as: COLACE Take 1 capsule (100 mg total) by mouth 2 (two) times daily. What changed: when to take this   Fish Oil 1000 MG Caps Take 1,000 mg by mouth 3 (three) times a week.   hydrALAZINE 25 MG tablet Commonly known as: APRESOLINE Take 25 mg by mouth 3 (three) times daily.   HYDROcodone-acetaminophen 5-325 MG tablet Commonly known as: NORCO/VICODIN Take 1-2 tablets by mouth every 6 (six) hours as needed for moderate pain.   losartan 100 MG tablet Commonly known as: COZAAR Take 1 tablet (100 mg total) by mouth at bedtime.   multivitamin with minerals Tabs tablet Take 1 tablet by mouth 3 (three) times a week.   oxybutynin 5 MG tablet Commonly known as: DITROPAN Take 1 tablet (5 mg total) by mouth every 8 (eight) hours as needed for bladder spasms.   oxycodone 5 MG capsule Commonly known as: OXY-IR Take 1 capsule (5 mg total) by mouth every 4 (four) hours as needed for pain.   potassium chloride SA 20 MEQ tablet Commonly known as: K-DUR Take 1 tablet (20 mEq total) by mouth daily. What changed: how much to take   prazosin 5 MG capsule Commonly known as: MINIPRESS Take 5 mg by mouth at bedtime.   tamsulosin 0.4 MG Caps capsule Commonly known as: Flomax Take 1 capsule (0.4 mg total) by mouth daily. What changed: when to take this       Allergies: No Known Allergies  Family History: Family History  Problem Relation Age of Onset  . Hypertension Father   . Bladder Cancer Neg Hx   . Prostate cancer Neg Hx   . Kidney cancer Neg Hx     Social History:  reports that he has never smoked. He has never used smokeless tobacco. He reports that he does not drink alcohol or use drugs.  ROS: All system review of systems is negative other than as per HPI.  Physical Exam: BP 125/68   Pulse 69   Ht 5\' 8"  (1.727 m)   Wt 230 lb (104.3 kg)   BMI 34.97 kg/m   Constitutional:  Alert and oriented, No acute distress. HEENT: [place] AT, moist mucus membranes.  Trachea  midline, no masses. Cardiovascular: No clubbing, cyanosis, or edema. Respiratory: Normal respiratory effort, no increased work of breathing.. Skin: No rashes, bruises or suspicious lesions. Neurologic: Grossly intact, no focal deficits, moving all 4 extremities. Psychiatric: Normal mood and affect.  Laboratory Data: Lab Results  Component Value Date   WBC 5.4 04/25/2019   HGB 14.3 05/09/2019   HCT 42.0 05/09/2019   MCV 86.2 04/25/2019   PLT 170 04/25/2019    Lab Results  Component Value Date   CREATININE 1.52 (H) 04/25/2019   Lab Results  Component Value Date   HGBA1C 6.2 (H) 05/15/2016    Urinalysis    Component Value Date/Time   COLORURINE RED (A) 04/25/2019 1050   APPEARANCEUR Cloudy (A) 05/17/2019 1344   LABSPEC 1.013 04/25/2019 1050   PHURINE 6.0 04/25/2019 1050   GLUCOSEU Negative 05/17/2019 1344   HGBUR LARGE (A) 04/25/2019 1050   BILIRUBINUR Negative 05/17/2019 1344   KETONESUR NEGATIVE 04/25/2019 1050   PROTEINUR 2+ (A) 05/17/2019 1344   PROTEINUR 100 (A) 04/25/2019 1050   NITRITE Negative 05/17/2019 1344   NITRITE NEGATIVE 04/25/2019 1050   LEUKOCYTESUR 1+ (A) 05/17/2019 1344   LEUKOCYTESUR NEGATIVE 04/25/2019 1050    Lab Results  Component Value Date   LABMICR See below: 05/17/2019   WBCUA 11-30 (A) 05/17/2019   LABEPIT 0-10 05/17/2019   BACTERIA Few 05/17/2019    Pertinent Imaging: CT stone protocol imaging from 04/25/2019 was personally reviewed again today as well as directly compared to intraoperative right retrograde pyelogram which shows a high-grade ureteral obstruction with severe hydroureter down to the level of the right distal ureter where there is an abrupt transition point.  Assessment & Plan:    1. Ureteral stricture Etiology of this ureteral stricture unclear but appears to be chronic based [facility] No intraluminal obstructing stones 5 ureteroscopically Able to pass a through the strictured ureter  Currently tolerating ureteral stent but has had severe infection from stents in the past I recommended a repeat CT scan to assess for the stone as it was not identified ureteroscopically, ? Extraluminal His is not an ideal candidate for ureteral reimplant Will discuss option including conservative observation, chronic stent vs. Reimplant at next visit - CT RENAL STONE STUDY; Future  2. Ureteral stone Not identified at the time of ureteroscopy Question whether this is within the ureter We will repeat CT scan's Patient is agreeable this plan - Urinalysis, Complete - Basic metabolic panel; Future - PSA; Future  3. CKD (chronic kidney disease) stage 3, GFR 30-59 ml/min ([facility]) Stable baseline creatinine, will recheck now has been relieved to assess for improvement  4. Benign prostatic hyperplasia without lower urinary tract symptoms Massive prostamegaly but relatively asymptomatic  5. Prostate cancer [facility]) History of prostate cancer, discussed that we need to obtain records We will check creatinine today He will bring his records with him to his next follow-up   Return in about 2 weeks (around 05/31/2019).  [name], MD  [facility] [address], [place], [place] [address] [phone]  I spent 25 min with this patient of which greater than 50% was spent in counseling and coordination of care with the patient.

## 2019-05-20 LAB — BASIC METABOLIC PANEL
BUN/Creatinine Ratio: 15 (ref 10–24)
BUN: 24 mg/dL (ref 8–27)
CO2: 23 mmol/L (ref 20–29)
Calcium: 8.9 mg/dL (ref 8.6–10.2)
Chloride: 95 mmol/L — ABNORMAL LOW (ref 96–106)
Creatinine, Ser: 1.64 mg/dL — ABNORMAL HIGH (ref 0.76–1.27)
GFR calc Af Amer: 48 mL/min/{1.73_m2} — ABNORMAL LOW (ref >59)
GFR calc non Af Amer: 41 mL/min/{1.73_m2} — ABNORMAL LOW (ref >59)
Glucose: 94 mg/dL (ref 65–99)
Potassium: 3.8 mmol/L (ref 3.5–5.2)
Sodium: 138 mmol/L (ref 134–144)

## 2019-05-20 LAB — PSA: Prostate Specific Ag, Serum: 10.5 ng/mL — ABNORMAL HIGH (ref 0.0–4.0)

## 2019-05-23 ENCOUNTER — Telehealth: Payer: Self-pay | Admitting: Urology

## 2019-05-23 NOTE — Telephone Encounter (Signed)
Pt called and states that he is supposed to be seen by Dr Erlene Quan, next week. The appt was not made at check out? Where can we put him in? There is nothing available. Please advise.

## 2019-05-23 NOTE — Telephone Encounter (Signed)
Appointment made, Patient called and aware.

## 2019-05-23 NOTE — Telephone Encounter (Signed)
Patient brought in his records from the [place] and a preliminary review of over thousand pages today reveals the following:  1.  Idiopathic right distal ureteral stricture status post stent placement 01/2016 (noted to have hydronephrosis on ultrasound 07/2015).  Operative intervention discussed, ultimate elected conservative management vs. Lost to follow up.  2.  Lasix renal scan on January 04, 2017 demonstrates differential function, 53.3% of the left and 46.67 for the right.  Mild hydroureter was appreciated on the right with relatively prompt uptake and excretion with slight delayed clearance but responded well to the Lasix without evidence of obstruction per this report.  This is unchanged per report from a previous Lasix renal scan on 07/08/2016.  3.  Also appears that he has a personal history of prostate cancer on surveillance.  He had a MRI of the pelvis with and without contrast demonstrating a 6.7 x 6.4 x 4.8 cm prostate gland with findings consistent with BPH.  There is a rounded area of T2 hypointensity near the seminal vesicle which is decreased in intensity of signal since the previous MRI in 2016 representing possible hemorrhage or postbiopsy change.   4.  PSA history 9.4 03/09/2018 8.17 02/05/2016 3.53 04/30/2011 3.0 09/18/2010 3.154 2010 3.0 3/27/2009TRUS vol 160 g  5.  Prostate biopsy results January 22, 2017, PSA at the time 8.17, Gleason 3+3 involving 1 of 12 cores, 4% of core.

## 2019-05-23 NOTE — Telephone Encounter (Signed)
Please review schedule.

## 2019-05-30 ENCOUNTER — Ambulatory Visit
Admission: RE | Admit: 2019-05-30 | Discharge: 2019-05-30 | Disposition: A | Discharge status: Home / Self Care | Payer: 59 | Source: Ambulatory Visit | Attending: Urology | Admitting: Urology

## 2019-05-30 ENCOUNTER — Other Ambulatory Visit: Payer: Self-pay

## 2019-05-30 DIAGNOSIS — N135 Crossing vessel and stricture of ureter without hydronephrosis: Secondary | ICD-10-CM | POA: Insufficient documentation

## 2019-05-31 ENCOUNTER — Other Ambulatory Visit: Payer: Self-pay | Admitting: Radiology

## 2019-05-31 ENCOUNTER — Telehealth: Payer: Self-pay | Admitting: Radiology

## 2019-05-31 ENCOUNTER — Encounter: Payer: Self-pay | Admitting: Urology

## 2019-05-31 ENCOUNTER — Ambulatory Visit (INDEPENDENT_AMBULATORY_CARE_PROVIDER_SITE_OTHER): Payer: 59 | Admitting: Urology

## 2019-05-31 VITALS — BP 164/72 | HR 64 | Ht 68.0 in | Wt 230.0 lb

## 2019-05-31 DIAGNOSIS — N201 Calculus of ureter: Secondary | ICD-10-CM

## 2019-05-31 DIAGNOSIS — M899 Disorder of bone, unspecified: Secondary | ICD-10-CM

## 2019-05-31 DIAGNOSIS — N135 Crossing vessel and stricture of ureter without hydronephrosis: Secondary | ICD-10-CM | POA: Diagnosis not present

## 2019-05-31 DIAGNOSIS — C61 Malignant neoplasm of prostate: Secondary | ICD-10-CM

## 2019-05-31 DIAGNOSIS — N200 Calculus of kidney: Secondary | ICD-10-CM

## 2019-05-31 NOTE — Progress Notes (Signed)
05/31/2019 11:05 AM   [name] 06/06/1946 [id number]   Chief Complaint  Patient presents with  . Nephrolithiasis    HPI: BROWNING SOUTHWOOD is a 73 y.o. male who presents today for follow-up of CT scan and multiple GU issues.  In the interim, I performed extensive chart review, greater than thousand pages from the [place].  Please see my phone note for details.  Patient has a known idiopathic right distal ureteral stricture and underwent stent placement 01/2016.  This was present from at least 2016 but we have no prior imaging before this.  His stent was ultimately removed secondary to severe infection.  He had follow-up with Lasix renal scan January 04, 2017 demonstrates differential function, 53.3% of the left and 46.67 for the right.  Mild hydroureter was appreciated on the right with relatively prompt uptake and excretion with slight delayed clearance but responded well to the Lasix without evidence of obstruction per this report.  This is unchanged per report from a previous Lasix renal scan on 07/08/2016.  It appears that conservative management was selected given absence of overt or severe obstruction.  In the interim, he presented to our emergency room with gross hematuria.  He was found have a stone near the level of the transition point and taken to the operating room on 05/09/2019 for ureteroscopy.  This revealed fairly high-grade appearing stricture although was able to be scoped.  The stone was not encountered in the very capacious portion of the ureter.  He returns today with follow-up noncontrast CT scan which shows the stone in similar position within the ureter.  The right hydronephrosis has resolved but he still has a significantly dilated right mid proximal and distal ureter to the level of the transition.  In the interim, he seems to be tolerating the stent well.  He is concerned today about some intermittent hematuria and hematospermia.  Mild occasional flank pain but not  severe.  No fevers, chills, dysuria or UTI symptoms.  In addition to the above, he does have a personal history of prostate cancer on active surveillance.  He had a MRI of the pelvis with and without contrast demonstrating a 6.7 x 6.4 x 4.8 cm prostate gland with findings consistent with BPH.  There is a rounded area of T2 hypointensity near the seminal vesicle which is decreased in intensity of signal since the previous MRI in 2016 representing possible hemorrhage or postbiopsy change.   10.5  05/19/19 9.4 03/09/2018 8.17 02/05/2016 3.53 04/30/2011 3.0 09/18/2010 3.154 2010 3.0 3/27/2009TRUS vol 160 g    PMH: Past Medical History:  Diagnosis Date  . Anxiety   . Arthritis   . Cancer ([place])    PROSTATE  . Chronic kidney disease    STAGE 3  . 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 Depression   . Diabetes mellitus without complication ([place])    patient follows diet only. no medications  . Dysrhythmia    irregular heart beat  . Essential hypertension   . History of kidney stones   . Keratoderma   . Post traumatic stress disorder (PTSD)   . Sleep apnea    C-PAP    Surgical History: Past Surgical History:  Procedure Laterality Date  . Arm Surgery    . BACK SURGERY  1967   Laminectomy on skull of neck  . COLONOSCOPY    . CRANIOTOMY  1967  . CYSTOSCOPY WITH URETEROSCOPY AND STENT PLACEMENT Right 01/2016  . CYSTOSCOPY/URETEROSCOPY/HOLMIUM  LASER/STENT PLACEMENT Right 05/09/2019   Procedure: CYSTOSCOPY/URETEROSCOPY/STENT PLACEMENT;  Surgeon: [name], MD;  Location: [facility] OR[facility];  Service: Urology;  Laterality: Right;  . HYDROCELE EXCISION Right 10/21/2016   Procedure: HYDROCELECTOMY ADULT;  Surgeon: [name], MD;  Location: [facility];  Service: Urology;  Laterality: Right;  . ORTHOPEDIC SURGERY    . TOTAL HIP ARTHROPLASTY Left 2006  . URETEROSCOPY Right 04/2016   stent removal- spesis    Home Medications:  Allergies as of 05/31/2019   No Known Allergies      Medication List       Accurate as of May 31, 2019 11:59 PM. If you have any questions, ask your nurse or doctor.        ampicillin 500 MG capsule Commonly known as: PRINCIPEN Take 1 capsule (500 mg total) by mouth 2 (two) times daily.   aspirin 81 MG chewable tablet Chew 1 tablet (81 mg total) by mouth daily.   buPROPion 100 MG 12 hr tablet Commonly known as: WELLBUTRIN SR Take 200 mg by mouth 2 (two) times daily.   citalopram 40 MG tablet Commonly known as: CELEXA Take 40 mg by mouth every morning.   diltiazem 360 MG 24 hr capsule Commonly known as: TIAZAC Take 360 mg by mouth every morning.   docusate sodium 100 MG capsule Commonly known as: COLACE Take 1 capsule (100 mg total) by mouth 2 (two) times daily. What changed: when to take this   Fish Oil 1000 MG Caps Take 1,000 mg by mouth 3 (three) times a week.   hydrALAZINE 25 MG tablet Commonly known as: APRESOLINE Take 25 mg by mouth 3 (three) times daily.   HYDROcodone-acetaminophen 5-325 MG tablet Commonly known as: NORCO/VICODIN Take 1-2 tablets by mouth every 6 (six) hours as needed for moderate pain.   losartan 100 MG tablet Commonly known as: COZAAR Take 1 tablet (100 mg total) by mouth at bedtime.   multivitamin with minerals Tabs tablet Take 1 tablet by mouth 3 (three) times a week.   oxybutynin 5 MG tablet Commonly known as: DITROPAN Take 1 tablet (5 mg total) by mouth every 8 (eight) hours as needed for bladder spasms.   oxycodone 5 MG capsule Commonly known as: OXY-IR Take 1 capsule (5 mg total) by mouth every 4 (four) hours as needed for pain.   potassium chloride SA 20 MEQ tablet Commonly known as: K-DUR Take 1 tablet (20 mEq total) by mouth daily. What changed: how much to take   prazosin 5 MG capsule Commonly known as: MINIPRESS Take 5 mg by mouth at bedtime.   tamsulosin 0.4 MG Caps capsule Commonly known as: Flomax Take 1 capsule (0.4 mg total) by mouth daily. What changed:  when to take this       Allergies: No Known Allergies  Family History: Family History  Problem Relation Age of Onset  . Hypertension Father   . Bladder Cancer Neg Hx   . Prostate cancer Neg Hx   . Kidney cancer Neg Hx     Social History:  reports that he has never smoked. He has never used smokeless tobacco. He reports that he does not drink alcohol or use drugs.  ROS: UROLOGY Frequent Urination?: No Hard to postpone urination?: No Burning/pain with urination?: No Get up at night to urinate?: No Leakage of urine?: No Urine stream starts and stops?: No Trouble starting stream?: No Do you have to strain to urinate?: No Blood in urine?: Yes Urinary tract infection?: No Sexually transmitted  disease?: No Injury to kidneys or bladder?: No Painful intercourse?: No Weak stream?: No Erection problems?: No Penile pain?: No  Gastrointestinal Nausea?: No Vomiting?: No Indigestion/heartburn?: No Diarrhea?: No Constipation?: No  Constitutional Fever: No Night sweats?: No Weight loss?: No Fatigue?: No  Skin Skin rash/lesions?: No Itching?: No  Eyes Blurred vision?: No Double vision?: No  Ears/Nose/Throat Sore throat?: No Sinus problems?: No  Hematologic/Lymphatic Swollen glands?: No Easy bruising?: No  Cardiovascular Leg swelling?: No Chest pain?: No  Respiratory Cough?: No Shortness of breath?: No  Endocrine Excessive thirst?: No  Musculoskeletal Back pain?: No Joint pain?: No  Neurological Headaches?: No Dizziness?: No  Psychologic Depression?: No Anxiety?: No  Physical Exam: BP (!) 164/72   Pulse 64   Ht 5\' 8"  (1.727 m)   Wt 230 lb (104.3 kg)   BMI 34.97 kg/m   Constitutional:  Alert and oriented, No acute distress.  Pleasant, somewhat fixated on ejaculatory dysfunction again today. HEENT: [place] AT, moist mucus membranes.  Trachea midline, no masses. MSK: Upper extremity injuries/severe scarring appreciated. Respiratory: Normal  respiratory effort, no increased work of breathing.. Skin: No rashes, bruises or suspicious lesions. Neurologic: Grossly intact, no focal deficits, moving all 4 extremities. Psychiatric: Normal mood and affect.  Laboratory Data: Lab Results  Component Value Date   WBC 5.4 04/25/2019   HGB 14.3 05/09/2019   HCT 42.0 05/09/2019   MCV 86.2 04/25/2019   PLT 170 04/25/2019    Lab Results  Component Value Date   CREATININE 1.64 (H) 05/19/2019   Lab Results  Component Value Date   HGBA1C 6.2 (H) 05/15/2016    Urinalysis Results for orders placed or performed in visit on 05/31/19  CULTURE, URINE COMPREHENSIVE   Specimen: Urine   UR  Result Value Ref Range   Urine Culture, Comprehensive Preliminary report    Organism ID, Bacteria Comment    Organism ID, Bacteria Comment   Microscopic Examination   URINE  Result Value Ref Range   WBC, UA 6-10 (A) 0 - 5 /hpf   RBC >30 (A) 0 - 2 /hpf   Epithelial Cells (non renal) 0-10 0 - 10 /hpf   Bacteria, UA Few None seen/Few  Urinalysis, Complete  Result Value Ref Range   Specific Gravity, UA 1.025 1.005 - 1.030   pH, UA 5.5 5.0 - 7.5   Color, UA Brown (A) Yellow   Appearance Ur Cloudy (A) Clear   Leukocytes,UA Trace (A) Negative   Protein,UA 3+ (A) Negative/Trace   Glucose, UA Negative Negative   Ketones, UA Negative Negative   RBC, UA 3+ (A) Negative   Bilirubin, UA Negative Negative   Urobilinogen, Ur 0.2 0.2 - 1.0 mg/dL   Nitrite, UA Positive (A) Negative   Microscopic Examination See below:     Pertinent Imaging:  Results for orders placed during the hospital encounter of 05/30/19  CT RENAL STONE STUDY   Narrative CLINICAL DATA:  Follow-up renal stone, gross hematuria with right lower quadrant pain, status post ureteral stent placement  EXAM: CT ABDOMEN AND PELVIS WITHOUT CONTRAST  TECHNIQUE: Multidetector CT imaging of the abdomen and pelvis was performed following the standard protocol without IV contrast.   COMPARISON:  04/25/2019  FINDINGS: Lower chest: Calcified granuloma in the right middle lobe (series 3/image 7), benign. Mild eventration of the right hemidiaphragm with associated right lower lobe atelectasis.  Hepatobiliary: Unenhanced liver is unremarkable.  Gallbladder is unremarkable. No intrahepatic or extrahepatic ductal dilatation.  Pancreas: Within normal limits.  Spleen: Within  normal limits.  Adrenals/Urinary Tract: Adrenal glands are within normal limits.  Two punctate nonobstructing right upper pole renal calculi (series 2/images 41 and 42). Left kidney is within normal limits. Prior mild right hydronephrosis has resolved with interval placement an indwelling right double-pigtail ureteral stent in satisfactory position. Moderately dilated/patulous mid/distal ureter with layering 8 mm mid right ureteral calculus at the level of the sacrum (series 2/image 66).  Bladder is thick-walled although underdistended.  Stomach/Bowel: Stomach is within normal limits.  No evidence of bowel obstruction.  Appendix is not discretely visualized.  Vascular/Lymphatic: No evidence of abdominal aortic aneurysm.  Atherosclerotic calcifications of the abdominal aorta and branch vessels.  No suspicious abdominopelvic lymphadenopathy.  Reproductive: Prostatomegaly.  Other: No abdominopelvic ascites.  Tiny fat containing periumbilical hernia (series 2/image 62).  Musculoskeletal: Degenerative changes of the visualized thoracolumbar spine.  Left hip arthroplasty, without evidence of complication.  Stable 17 mm sclerotic lesion in the left iliac bone (series 2/image 66), nonspecific in the setting of prostate cancer.  IMPRESSION: 8 mm mid right ureteral calculus layering within a patulous sacral ureter.  Prior mild right hydronephrosis has resolved with indwelling right double-pigtail ureteral stent.  Two punctate nonobstructing right upper pole renal calculi.   Prostatomegaly.  Stable 17 mm sclerotic lesion in the left iliac bone, nonspecific in the setting of known prostate cancer. Correlate with PSA and consider bone scan as clinically warranted.   Electronically Signed   By: Julian Hy M.D.   On: 05/30/2019 16:53    CT scan was personally reviewed today.  This is also shared with the patient and diagrams were drawn.  Assessment & Plan:    1. Ureteral stricture Etiology unclear, appears to be chronic with previous demonstrable reasonable drainage [facility] Lasix renal scan  We discussed this at length today.  In the absence of intraluminal obstruction or extrinsic obstruction, more definitive management with ureteral reimplant could be considered.  Alternatively, we continue to follow this conservatively, remove his stent once the stone is been adequately treated and repeat a renal Lasix scan down the road to ensure that this is not a high-grade obstruction  No change in renal function with ureteral stent in place, BM.  Appears to be stable, unchanged.  There is very good parenchyma of his right kidney despite stricture.  He is leaning towards conservative management.  Once the stone is been treated, will remove the stent and performed Lasix renal scan.  2. Right ureteral stone Suspect the right ureteral stone was not identified in the very capacious ureter as indicated by follow-up CT scan  I recommended that we return to the operating room for second try at ureteroscopy, laser lithotripsy  Risk and benefits were discussed in detail.  Patient is agreeable this plan.  All questions answered.  He understands that he will need a stent exchange at the time of the procedure.  All questions answered.  Preop UA/urine culture. - Urinalysis, Complete - CULTURE, URINE COMPREHENSIVE  3. Bone lesion Incidental sclerotic lesion on the bone, given his history of shrapnel, more likely related to previous  trauma or then prostate cancer, see below  4. Prostate cancer [facility]) Personal history of low risk prostate cancer and very large prostate  We will likely follow-up with MRI for surveillance purposes.  Most recent PSA in the setting of recent endoscopic manipulation fairly stable.  He is agreeable this plan.  Plan for MRI once stent is on stone is treated.  [name], MD  [facility] [address] [place], [place] [address] [phone]  I spent 40 min with this patient of which greater than 50% was spent in counseling and coordination of care with the patient.  This includes an extensive review of records in anticipation of today's visit.

## 2019-05-31 NOTE — H&P (View-Only) (Signed)
05/31/2019 11:05 AM   Javier Watson 1946-09-18 [id number]   Chief Complaint  Patient presents with  . Nephrolithiasis    HPI: Javier Watson is a 73 y.o. male who presents today for follow-up of CT scan and multiple GU issues.  In the interim, I performed extensive chart review, greater than thousand pages from the [place].  Please see my phone note for details.  Patient has a known idiopathic right distal ureteral stricture and underwent stent placement 01/2016.  This was present from at least 2016 but we have no prior imaging before this.  His stent was ultimately removed secondary to severe infection.  He had follow-up with Lasix renal scan January 04, 2017 demonstrates differential function, 53.3% of the left and 46.67 for the right.  Mild hydroureter was appreciated on the right with relatively prompt uptake and excretion with slight delayed clearance but responded well to the Lasix without evidence of obstruction per this report.  This is unchanged per report from a previous Lasix renal scan on 07/08/2016.  It appears that conservative management was selected given absence of overt or severe obstruction.  In the interim, he presented to our emergency room with gross hematuria.  He was found have a stone near the level of the transition point and taken to the operating room on 05/09/2019 for ureteroscopy.  This revealed fairly high-grade appearing stricture although was able to be scoped.  The stone was not encountered in the very capacious portion of the ureter.  He returns today with follow-up noncontrast CT scan which shows the stone in similar position within the ureter.  The right hydronephrosis has resolved but he still has a significantly dilated right mid proximal and distal ureter to the level of the transition.  In the interim, he seems to be tolerating the stent well.  He is concerned today about some intermittent hematuria and hematospermia.  Mild occasional flank pain but not  severe.  No fevers, chills, dysuria or UTI symptoms.  In addition to the above, he does have a personal history of prostate cancer on active surveillance.  He had a MRI of the pelvis with and without contrast demonstrating a 6.7 x 6.4 x 4.8 cm prostate gland with findings consistent with BPH.  There is a rounded area of T2 hypointensity near the seminal vesicle which is decreased in intensity of signal since the previous MRI in 2016 representing possible hemorrhage or postbiopsy change.   10.5  05/19/19 9.4 03/09/2018 8.17 02/05/2016 3.53 04/30/2011 3.0 09/18/2010 3.154 2010 3.0 3/27/2009TRUS vol 160 g    PMH: Past Medical History:  Diagnosis Date  . Anxiety   . Arthritis   . Cancer ([place])    PROSTATE  . Chronic kidney disease    STAGE 3  . 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 Depression   . Diabetes mellitus without complication ([place])    patient follows diet only. no medications  . Dysrhythmia    irregular heart beat  . Essential hypertension   . History of kidney stones   . Keratoderma   . Post traumatic stress disorder (PTSD)   . Sleep apnea    C-PAP    Surgical History: Past Surgical History:  Procedure Laterality Date  . Arm Surgery    . BACK SURGERY  1967   Laminectomy on skull of neck  . COLONOSCOPY    . CRANIOTOMY  1967  . CYSTOSCOPY WITH URETEROSCOPY AND STENT PLACEMENT Right 01/2016  . CYSTOSCOPY/URETEROSCOPY/HOLMIUM  LASER/STENT PLACEMENT Right 05/09/2019   Procedure: CYSTOSCOPY/URETEROSCOPY/STENT PLACEMENT;  Surgeon: [name], MD;  Location: [facility] ORS;  Service: Urology;  Laterality: Right;  . HYDROCELE EXCISION Right 10/21/2016   Procedure: HYDROCELECTOMY ADULT;  Surgeon: [name], MD;  Location: [facility] ORS;  Service: Urology;  Laterality: Right;  . ORTHOPEDIC SURGERY    . TOTAL HIP ARTHROPLASTY Left 2006  . URETEROSCOPY Right 04/2016   stent removal- spesis    Home Medications:  Allergies as of 05/31/2019   No Known Allergies      Medication List       Accurate as of May 31, 2019 11:59 PM. If you have any questions, ask your nurse or doctor.        ampicillin 500 MG capsule Commonly known as: PRINCIPEN Take 1 capsule (500 mg total) by mouth 2 (two) times daily.   aspirin 81 MG chewable tablet Chew 1 tablet (81 mg total) by mouth daily.   buPROPion 100 MG 12 hr tablet Commonly known as: WELLBUTRIN SR Take 200 mg by mouth 2 (two) times daily.   citalopram 40 MG tablet Commonly known as: CELEXA Take 40 mg by mouth every morning.   diltiazem 360 MG 24 hr capsule Commonly known as: TIAZAC Take 360 mg by mouth every morning.   docusate sodium 100 MG capsule Commonly known as: COLACE Take 1 capsule (100 mg total) by mouth 2 (two) times daily. What changed: when to take this   Fish Oil 1000 MG Caps Take 1,000 mg by mouth 3 (three) times a week.   hydrALAZINE 25 MG tablet Commonly known as: APRESOLINE Take 25 mg by mouth 3 (three) times daily.   HYDROcodone-acetaminophen 5-325 MG tablet Commonly known as: NORCO/VICODIN Take 1-2 tablets by mouth every 6 (six) hours as needed for moderate pain.   losartan 100 MG tablet Commonly known as: COZAAR Take 1 tablet (100 mg total) by mouth at bedtime.   multivitamin with minerals Tabs tablet Take 1 tablet by mouth 3 (three) times a week.   oxybutynin 5 MG tablet Commonly known as: DITROPAN Take 1 tablet (5 mg total) by mouth every 8 (eight) hours as needed for bladder spasms.   oxycodone 5 MG capsule Commonly known as: OXY-IR Take 1 capsule (5 mg total) by mouth every 4 (four) hours as needed for pain.   potassium chloride SA 20 MEQ tablet Commonly known as: K-DUR Take 1 tablet (20 mEq total) by mouth daily. What changed: how much to take   prazosin 5 MG capsule Commonly known as: MINIPRESS Take 5 mg by mouth at bedtime.   tamsulosin 0.4 MG Caps capsule Commonly known as: Flomax Take 1 capsule (0.4 mg total) by mouth daily. What changed:  when to take this       Allergies: No Known Allergies  Family History: Family History  Problem Relation Age of Onset  . Hypertension Father   . Bladder Cancer Neg Hx   . Prostate cancer Neg Hx   . Kidney cancer Neg Hx     Social History:  reports that he has never smoked. He has never used smokeless tobacco. He reports that he does not drink alcohol or use drugs.  ROS: UROLOGY Frequent Urination?: No Hard to postpone urination?: No Burning/pain with urination?: No Get up at night to urinate?: No Leakage of urine?: No Urine stream starts and stops?: No Trouble starting stream?: No Do you have to strain to urinate?: No Blood in urine?: Yes Urinary tract infection?: No Sexually transmitted  disease?: No Injury to kidneys or bladder?: No Painful intercourse?: No Weak stream?: No Erection problems?: No Penile pain?: No  Gastrointestinal Nausea?: No Vomiting?: No Indigestion/heartburn?: No Diarrhea?: No Constipation?: No  Constitutional Fever: No Night sweats?: No Weight loss?: No Fatigue?: No  Skin Skin rash/lesions?: No Itching?: No  Eyes Blurred vision?: No Double vision?: No  Ears/Nose/Throat Sore throat?: No Sinus problems?: No  Hematologic/Lymphatic Swollen glands?: No Easy bruising?: No  Cardiovascular Leg swelling?: No Chest pain?: No  Respiratory Cough?: No Shortness of breath?: No  Endocrine Excessive thirst?: No  Musculoskeletal Back pain?: No Joint pain?: No  Neurological Headaches?: No Dizziness?: No  Psychologic Depression?: No Anxiety?: No  Physical Exam: BP (!) 164/72   Pulse 64   Ht 5\' 8"  (1.727 m)   Wt 230 lb (104.3 kg)   BMI 34.97 kg/m   Constitutional:  Alert and oriented, No acute distress.  Pleasant, somewhat fixated on ejaculatory dysfunction again today. HEENT: [place] AT, moist mucus membranes.  Trachea midline, no masses. MSK: Upper extremity injuries/severe scarring appreciated. Respiratory: Normal  respiratory effort, no increased work of breathing.. Skin: No rashes, bruises or suspicious lesions. Neurologic: Grossly intact, no focal deficits, moving all 4 extremities. Psychiatric: Normal mood and affect.  Laboratory Data: Lab Results  Component Value Date   WBC 5.4 04/25/2019   HGB 14.3 05/09/2019   HCT 42.0 05/09/2019   MCV 86.2 04/25/2019   PLT 170 04/25/2019    Lab Results  Component Value Date   CREATININE 1.64 (H) 05/19/2019   Lab Results  Component Value Date   HGBA1C 6.2 (H) 05/15/2016    Urinalysis Results for orders placed or performed in visit on 05/31/19  CULTURE, URINE COMPREHENSIVE   Specimen: Urine   UR  Result Value Ref Range   Urine Culture, Comprehensive Preliminary report    Organism ID, Bacteria Comment    Organism ID, Bacteria Comment   Microscopic Examination   URINE  Result Value Ref Range   WBC, UA 6-10 (A) 0 - 5 /hpf   RBC >30 (A) 0 - 2 /hpf   Epithelial Cells (non renal) 0-10 0 - 10 /hpf   Bacteria, UA Few None seen/Few  Urinalysis, Complete  Result Value Ref Range   Specific Gravity, UA 1.025 1.005 - 1.030   pH, UA 5.5 5.0 - 7.5   Color, UA Brown (A) Yellow   Appearance Ur Cloudy (A) Clear   Leukocytes,UA Trace (A) Negative   Protein,UA 3+ (A) Negative/Trace   Glucose, UA Negative Negative   Ketones, UA Negative Negative   RBC, UA 3+ (A) Negative   Bilirubin, UA Negative Negative   Urobilinogen, Ur 0.2 0.2 - 1.0 mg/dL   Nitrite, UA Positive (A) Negative   Microscopic Examination See below:     Pertinent Imaging:  Results for orders placed during the hospital encounter of 05/30/19  CT RENAL STONE STUDY   Narrative CLINICAL DATA:  Follow-up renal stone, gross hematuria with right lower quadrant pain, status post ureteral stent placement  EXAM: CT ABDOMEN AND PELVIS WITHOUT CONTRAST  TECHNIQUE: Multidetector CT imaging of the abdomen and pelvis was performed following the standard protocol without IV contrast.   COMPARISON:  04/25/2019  FINDINGS: Lower chest: Calcified granuloma in the right middle lobe (series 3/image 7), benign. Mild eventration of the right hemidiaphragm with associated right lower lobe atelectasis.  Hepatobiliary: Unenhanced liver is unremarkable.  Gallbladder is unremarkable. No intrahepatic or extrahepatic ductal dilatation.  Pancreas: Within normal limits.  Spleen: Within  normal limits.  Adrenals/Urinary Tract: Adrenal glands are within normal limits.  Two punctate nonobstructing right upper pole renal calculi (series 2/images 41 and 42). Left kidney is within normal limits. Prior mild right hydronephrosis has resolved with interval placement an indwelling right double-pigtail ureteral stent in satisfactory position. Moderately dilated/patulous mid/distal ureter with layering 8 mm mid right ureteral calculus at the level of the sacrum (series 2/image 66).  Bladder is thick-walled although underdistended.  Stomach/Bowel: Stomach is within normal limits.  No evidence of bowel obstruction.  Appendix is not discretely visualized.  Vascular/Lymphatic: No evidence of abdominal aortic aneurysm.  Atherosclerotic calcifications of the abdominal aorta and branch vessels.  No suspicious abdominopelvic lymphadenopathy.  Reproductive: Prostatomegaly.  Other: No abdominopelvic ascites.  Tiny fat containing periumbilical hernia (series 2/image 62).  Musculoskeletal: Degenerative changes of the visualized thoracolumbar spine.  Left hip arthroplasty, without evidence of complication.  Stable 17 mm sclerotic lesion in the left iliac bone (series 2/image 66), nonspecific in the setting of prostate cancer.  IMPRESSION: 8 mm mid right ureteral calculus layering within a patulous sacral ureter.  Prior mild right hydronephrosis has resolved with indwelling right double-pigtail ureteral stent.  Two punctate nonobstructing right upper pole renal calculi.   Prostatomegaly.  Stable 17 mm sclerotic lesion in the left iliac bone, nonspecific in the setting of known prostate cancer. Correlate with PSA and consider bone scan as clinically warranted.   Electronically Signed   By: Julian Hy M.D.   On: 05/30/2019 16:53    CT scan was personally reviewed today.  This is also shared with the patient and diagrams were drawn.  Assessment & Plan:    1. Ureteral stricture Etiology unclear, appears to be chronic with previous demonstrable reasonable drainage without stent [facility] back to [facility] Lasix renal scan  We discussed this at length today.  In the absence of intraluminal obstruction or extrinsic obstruction, more definitive management with ureteral reimplant could be considered.  Alternatively, we continue to follow this conservatively, remove his stent once the stone is been adequately treated and repeat a renal Lasix scan down the road to ensure that this is not a high-grade obstruction  No change in renal function with ureteral stent in place, BM.  Appears to be stable, unchanged.  There is very good parenchyma of his right kidney despite stricture.  He is leaning towards conservative management.  Once the stone is been treated, will remove the stent and performed Lasix renal scan.  2. Right ureteral stone Suspect the right ureteral stone was not identified in the very capacious ureter as indicated by follow-up CT scan  I recommended that we return to the operating room for second try at ureteroscopy, laser lithotripsy  Risk and benefits were discussed in detail.  Patient is agreeable this plan.  All questions answered.  He understands that he will need a stent exchange at the time of the procedure.  All questions answered.  Preop UA/urine culture. - Urinalysis, Complete - CULTURE, URINE COMPREHENSIVE  3. Bone lesion Incidental sclerotic lesion on the bone, given his history of shrapnel, more likely related to previous  trauma or then prostate cancer, see below  4. Prostate cancer [facility]) Personal history of low risk prostate cancer and very large prostate  We will likely follow-up with MRI for surveillance purposes.  Most recent PSA in the setting of recent endoscopic manipulation fairly stable.  He is agreeable this plan.  Plan for MRI once stent is on stone is treated.  [name], MD  [facility] [address] [place], [place] [address] [phone]  I spent 40 min with this patient of which greater than 50% was spent in counseling and coordination of care with the patient.  This includes an extensive review of records in anticipation of today's visit.

## 2019-05-31 NOTE — Telephone Encounter (Signed)
Patient was given the [facility] Information form below as well as the Instructions for Pre-Admission Testing form & a map of [facility].    [place], [place], [place] [address] Telephone: [phone] Fax: [phone]   Thank you for choosing [facility] for your upcoming surgery!  We are always here to assist in your urological needs.  Please read the following information with specific details for your upcoming appointments related to your surgery. Please contact [facility] at [phone] Option 3 with any questions.  The Name of Your Surgery: Right ureteroscopy, laser lithotripsy, stone removal, ureteral stent placement Your Surgery Date: 06/06/2019 Your Surgeon: [name]  Please call Same Day Surgery at [phone] between the hours of 1pm-3pm one day prior to your surgery. They will inform you of the time to arrive at Same Day Surgery which is located on the second floor of the [facility].   Please refer to the attached letter regarding instructions for Pre-Admission Testing. You will receive a call from the [place] office regarding your appointment with them.  The Pre-Admission Testing office is located at [place], on the first floor of the [place] [facility] in [place] (office is to the right as you enter through the [employer] of the [facility]). Please have all medications you are currently taking and your insurance card available.  A COVID-19 test will be required prior to surgery and once test is performed you will need to remain in quarantine until the day of surgery. Patient was advised to have nothing to eat or drink after midnight the night prior to surgery except that he may have only water until 2 hours before surgery with nothing to drink within 2 hours of surgery.  The patient states  he currently does not take his aspirin. Patient's questions were answered and he expressed understanding of these instructions.

## 2019-06-01 LAB — URINALYSIS, COMPLETE
Bilirubin, UA: NEGATIVE
Glucose, UA: NEGATIVE
Ketones, UA: NEGATIVE
Nitrite, UA: POSITIVE — AB
Specific Gravity, UA: 1.025 (ref 1.005–1.030)
Urobilinogen, Ur: 0.2 mg/dL (ref 0.2–1.0)
pH, UA: 5.5 (ref 5.0–7.5)

## 2019-06-01 LAB — MICROSCOPIC EXAMINATION: RBC, Urine: >30 /hpf — AB (ref 0–2)

## 2019-06-02 ENCOUNTER — Other Ambulatory Visit
Admission: RE | Admit: 2019-06-02 | Discharge: 2019-06-02 | Disposition: A | Discharge status: Home / Self Care | Payer: 59 | Source: Ambulatory Visit | Attending: Urology | Admitting: Urology

## 2019-06-02 ENCOUNTER — Other Ambulatory Visit: Payer: Self-pay

## 2019-06-02 DIAGNOSIS — Z1159 Encounter for screening for other viral diseases: Secondary | ICD-10-CM | POA: Diagnosis not present

## 2019-06-02 LAB — CULTURE, URINE COMPREHENSIVE

## 2019-06-02 LAB — SARS CORONAVIRUS 2 (TAT 6-24 HRS): SARS Coronavirus 2: NEGATIVE

## 2019-06-03 ENCOUNTER — Inpatient Hospital Stay: Admission: RE | Admit: 2019-06-03 | Payer: Non-veteran care | Source: Ambulatory Visit

## 2019-06-03 ENCOUNTER — Telehealth: Payer: Self-pay | Admitting: *Deleted

## 2019-06-03 MED ORDER — NITROFURANTOIN MONOHYD MACRO 100 MG PO CAPS: 100.0000 mg | ORAL_CAPSULE | Freq: Two times a day (BID) | ORAL | 0 refills | Status: DC

## 2019-06-03 NOTE — Telephone Encounter (Signed)
Informed patient to pick up Macrobid per Dr. Louretta Shorten culture showed infection. Patient verbalized understanding.

## 2019-06-05 MED ORDER — CEFAZOLIN SODIUM-DEXTROSE 2-4 GM/100ML-% IV SOLN
2.0000 g | INTRAVENOUS | Status: AC
  Administered 2019-06-06: 2 g via INTRAVENOUS

## 2019-06-06 ENCOUNTER — Other Ambulatory Visit: Payer: Self-pay

## 2019-06-06 ENCOUNTER — Encounter: Payer: Self-pay | Admitting: *Deleted

## 2019-06-06 ENCOUNTER — Ambulatory Visit: Payer: 59 | Admitting: Anesthesiology

## 2019-06-06 ENCOUNTER — Ambulatory Visit
Admission: RE | Admit: 2019-06-06 | Discharge: 2019-06-06 | Disposition: A | Discharge status: Home / Self Care | Payer: 59 | Attending: Urology | Admitting: Urology

## 2019-06-06 ENCOUNTER — Encounter
Admission: RE | Disposition: A | Discharge status: Home / Self Care | Payer: Self-pay | Source: Home / Self Care | Attending: Urology

## 2019-06-06 DIAGNOSIS — G4730 Sleep apnea, unspecified: Secondary | ICD-10-CM | POA: Insufficient documentation

## 2019-06-06 DIAGNOSIS — F419 Anxiety disorder, unspecified: Secondary | ICD-10-CM | POA: Diagnosis not present

## 2019-06-06 DIAGNOSIS — E1122 Type 2 diabetes mellitus with diabetic chronic kidney disease: Secondary | ICD-10-CM | POA: Diagnosis not present

## 2019-06-06 DIAGNOSIS — F329 Major depressive disorder, single episode, unspecified: Secondary | ICD-10-CM | POA: Insufficient documentation

## 2019-06-06 DIAGNOSIS — Z8249 Family history of ischemic heart disease and other diseases of the circulatory system: Secondary | ICD-10-CM | POA: Insufficient documentation

## 2019-06-06 DIAGNOSIS — N131 Hydronephrosis with ureteral stricture, not elsewhere classified: Secondary | ICD-10-CM

## 2019-06-06 DIAGNOSIS — I472 Ventricular tachycardia: Secondary | ICD-10-CM | POA: Insufficient documentation

## 2019-06-06 DIAGNOSIS — N132 Hydronephrosis with renal and ureteral calculous obstruction: Secondary | ICD-10-CM | POA: Diagnosis present

## 2019-06-06 DIAGNOSIS — Z7982 Long term (current) use of aspirin: Secondary | ICD-10-CM | POA: Diagnosis not present

## 2019-06-06 DIAGNOSIS — I252 Old myocardial infarction: Secondary | ICD-10-CM | POA: Insufficient documentation

## 2019-06-06 DIAGNOSIS — Z87442 Personal history of urinary calculi: Secondary | ICD-10-CM | POA: Diagnosis not present

## 2019-06-06 DIAGNOSIS — G8929 Other chronic pain: Secondary | ICD-10-CM | POA: Diagnosis not present

## 2019-06-06 DIAGNOSIS — N183 Chronic kidney disease, stage 3 (moderate): Secondary | ICD-10-CM | POA: Diagnosis not present

## 2019-06-06 DIAGNOSIS — Z79899 Other long term (current) drug therapy: Secondary | ICD-10-CM | POA: Diagnosis not present

## 2019-06-06 DIAGNOSIS — R361 Hematospermia: Secondary | ICD-10-CM | POA: Diagnosis not present

## 2019-06-06 DIAGNOSIS — M1990 Unspecified osteoarthritis, unspecified site: Secondary | ICD-10-CM | POA: Insufficient documentation

## 2019-06-06 DIAGNOSIS — Z96642 Presence of left artificial hip joint: Secondary | ICD-10-CM | POA: Insufficient documentation

## 2019-06-06 DIAGNOSIS — Z8546 Personal history of malignant neoplasm of prostate: Secondary | ICD-10-CM | POA: Insufficient documentation

## 2019-06-06 DIAGNOSIS — E669 Obesity, unspecified: Secondary | ICD-10-CM | POA: Diagnosis not present

## 2019-06-06 DIAGNOSIS — Z6834 Body mass index (BMI) 34.0-34.9, adult: Secondary | ICD-10-CM | POA: Insufficient documentation

## 2019-06-06 DIAGNOSIS — I129 Hypertensive chronic kidney disease with stage 1 through stage 4 chronic kidney disease, or unspecified chronic kidney disease: Secondary | ICD-10-CM | POA: Diagnosis not present

## 2019-06-06 DIAGNOSIS — N400 Enlarged prostate without lower urinary tract symptoms: Secondary | ICD-10-CM | POA: Insufficient documentation

## 2019-06-06 DIAGNOSIS — N201 Calculus of ureter: Secondary | ICD-10-CM

## 2019-06-06 DIAGNOSIS — F4310 Post-traumatic stress disorder, unspecified: Secondary | ICD-10-CM | POA: Insufficient documentation

## 2019-06-06 DIAGNOSIS — M899 Disorder of bone, unspecified: Secondary | ICD-10-CM | POA: Insufficient documentation

## 2019-06-06 HISTORY — PX: CYSTOSCOPY/URETEROSCOPY/HOLMIUM LASER/STENT PLACEMENT: SHX6546

## 2019-06-06 HISTORY — PX: CYSTOSCOPY W/ RETROGRADES: SHX1426

## 2019-06-06 HISTORY — PX: STONE EXTRACTION WITH BASKET: SHX5318

## 2019-06-06 LAB — GLUCOSE, CAPILLARY: Glucose-Capillary: 98 mg/dL (ref 70–99)

## 2019-06-06 SURGERY — CYSTOSCOPY/URETEROSCOPY/HOLMIUM LASER/STENT PLACEMENT
Anesthesia: General | Site: Ureter | Laterality: Right

## 2019-06-06 MED ORDER — PROPOFOL 10 MG/ML IV BOLUS
INTRAVENOUS | Status: DC | PRN
  Administered 2019-06-06: 150 mg via INTRAVENOUS
  Administered 2019-06-06: 50 mg via INTRAVENOUS

## 2019-06-06 MED ORDER — FENTANYL CITRATE (PF) 100 MCG/2ML IJ SOLN
INTRAMUSCULAR | Status: AC
  Filled 2019-06-06: qty 2

## 2019-06-06 MED ORDER — PHENYLEPHRINE HCL (PRESSORS) 10 MG/ML IV SOLN
INTRAVENOUS | Status: DC | PRN
  Administered 2019-06-06 (×3): 100 ug via INTRAVENOUS
  Administered 2019-06-06: 200 ug via INTRAVENOUS
  Administered 2019-06-06: 100 ug via INTRAVENOUS

## 2019-06-06 MED ORDER — ONDANSETRON HCL 4 MG/2ML IJ SOLN: 4.0000 mg | Freq: Once | INTRAMUSCULAR | Status: DC | PRN

## 2019-06-06 MED ORDER — FENTANYL CITRATE (PF) 100 MCG/2ML IJ SOLN
INTRAMUSCULAR | Status: DC | PRN
  Administered 2019-06-06 (×4): 50 ug via INTRAVENOUS

## 2019-06-06 MED ORDER — ROCURONIUM BROMIDE 50 MG/5ML IV SOLN
INTRAVENOUS | Status: AC
  Filled 2019-06-06: qty 1

## 2019-06-06 MED ORDER — LACTATED RINGERS IV SOLN: INTRAVENOUS | Status: DC | PRN

## 2019-06-06 MED ORDER — SODIUM CHLORIDE 0.9 % IV SOLN
INTRAVENOUS | Status: DC | PRN
  Administered 2019-06-06: 50 ug/min via INTRAVENOUS

## 2019-06-06 MED ORDER — MIDAZOLAM HCL 2 MG/2ML IJ SOLN
INTRAMUSCULAR | Status: AC
  Filled 2019-06-06: qty 2

## 2019-06-06 MED ORDER — DEXAMETHASONE SODIUM PHOSPHATE 10 MG/ML IJ SOLN
INTRAMUSCULAR | Status: DC | PRN
  Administered 2019-06-06: 10 mg via INTRAVENOUS

## 2019-06-06 MED ORDER — SUGAMMADEX SODIUM 500 MG/5ML IV SOLN
INTRAVENOUS | Status: AC
  Filled 2019-06-06: qty 5

## 2019-06-06 MED ORDER — FENTANYL CITRATE (PF) 100 MCG/2ML IJ SOLN: 25.0000 ug | INTRAMUSCULAR | Status: DC | PRN

## 2019-06-06 MED ORDER — GLYCOPYRROLATE 0.2 MG/ML IJ SOLN
INTRAMUSCULAR | Status: DC | PRN
  Administered 2019-06-06: 0.2 mg via INTRAVENOUS

## 2019-06-06 MED ORDER — SUCCINYLCHOLINE CHLORIDE 20 MG/ML IJ SOLN
INTRAMUSCULAR | Status: DC | PRN
  Administered 2019-06-06: 100 mg via INTRAVENOUS

## 2019-06-06 MED ORDER — EPHEDRINE SULFATE 50 MG/ML IJ SOLN
INTRAMUSCULAR | Status: AC
  Filled 2019-06-06: qty 1

## 2019-06-06 MED ORDER — FAMOTIDINE 20 MG PO TABS
20.0000 mg | ORAL_TABLET | Freq: Once | ORAL | Status: AC
  Administered 2019-06-06: 12:00:00 20 mg via ORAL

## 2019-06-06 MED ORDER — ONDANSETRON HCL 4 MG/2ML IJ SOLN
INTRAMUSCULAR | Status: AC
  Filled 2019-06-06: qty 2

## 2019-06-06 MED ORDER — DEXMEDETOMIDINE HCL IN NACL 200 MCG/50ML IV SOLN
INTRAVENOUS | Status: AC
  Filled 2019-06-06: qty 50

## 2019-06-06 MED ORDER — LIDOCAINE HCL (CARDIAC) PF 100 MG/5ML IV SOSY
PREFILLED_SYRINGE | INTRAVENOUS | Status: DC | PRN
  Administered 2019-06-06: 50 mg via INTRAVENOUS

## 2019-06-06 MED ORDER — DEXMEDETOMIDINE HCL 200 MCG/2ML IV SOLN
INTRAVENOUS | Status: DC | PRN
  Administered 2019-06-06: 8 ug via INTRAVENOUS

## 2019-06-06 MED ORDER — LIDOCAINE HCL (PF) 2 % IJ SOLN
INTRAMUSCULAR | Status: AC
  Filled 2019-06-06: qty 10

## 2019-06-06 MED ORDER — SUGAMMADEX SODIUM 500 MG/5ML IV SOLN
INTRAVENOUS | Status: DC | PRN
  Administered 2019-06-06: 210 mg via INTRAVENOUS

## 2019-06-06 MED ORDER — FAMOTIDINE 20 MG PO TABS
ORAL_TABLET | ORAL | Status: AC
  Administered 2019-06-06: 12:00:00 20 mg via ORAL
  Filled 2019-06-06: qty 1

## 2019-06-06 MED ORDER — ONDANSETRON HCL 4 MG/2ML IJ SOLN
INTRAMUSCULAR | Status: DC | PRN
  Administered 2019-06-06: 4 mg via INTRAVENOUS

## 2019-06-06 MED ORDER — GLYCOPYRROLATE 0.2 MG/ML IJ SOLN
INTRAMUSCULAR | Status: AC
  Filled 2019-06-06: qty 1

## 2019-06-06 MED ORDER — PROPOFOL 10 MG/ML IV BOLUS
INTRAVENOUS | Status: AC
  Filled 2019-06-06: qty 20

## 2019-06-06 MED ORDER — EPHEDRINE SULFATE 50 MG/ML IJ SOLN
INTRAMUSCULAR | Status: DC | PRN
  Administered 2019-06-06 (×3): 10 mg via INTRAVENOUS

## 2019-06-06 MED ORDER — IOHEXOL 180 MG/ML  SOLN
INTRAMUSCULAR | Status: DC | PRN
  Administered 2019-06-06: 20 mL

## 2019-06-06 MED ORDER — MIDAZOLAM HCL 2 MG/2ML IJ SOLN
INTRAMUSCULAR | Status: DC | PRN
  Administered 2019-06-06: 2 mg via INTRAVENOUS

## 2019-06-06 MED ORDER — HYDROCODONE-ACETAMINOPHEN 5-325 MG PO TABS: 1.0000 | ORAL_TABLET | Freq: Four times a day (QID) | ORAL | 0 refills | Status: DC | PRN

## 2019-06-06 MED ORDER — CEFAZOLIN SODIUM-DEXTROSE 2-4 GM/100ML-% IV SOLN
INTRAVENOUS | Status: AC
  Filled 2019-06-06: qty 100

## 2019-06-06 MED ORDER — FENTANYL CITRATE (PF) 250 MCG/5ML IJ SOLN
INTRAMUSCULAR | Status: AC
  Filled 2019-06-06: qty 5

## 2019-06-06 MED ORDER — ROCURONIUM BROMIDE 100 MG/10ML IV SOLN
INTRAVENOUS | Status: DC | PRN
  Administered 2019-06-06: 10 mg via INTRAVENOUS
  Administered 2019-06-06: 20 mg via INTRAVENOUS

## 2019-06-06 MED ORDER — SODIUM CHLORIDE 0.9 % IV SOLN
INTRAVENOUS | Status: DC
  Administered 2019-06-06 (×2): via INTRAVENOUS

## 2019-06-06 SURGICAL SUPPLY — 33 items
BAG DRAIN CYSTO-URO LG1000N (MISCELLANEOUS) ×3 IMPLANT
BASKET ZERO TIP 1.9FR (BASKET) ×1 IMPLANT
BRUSH SCRUB EZ 1% IODOPHOR (MISCELLANEOUS) ×3 IMPLANT
BSKT STON RTRVL ZERO TP 1.9FR (BASKET) ×2
CATH BEACON 5 .035 65 KMP TIP (CATHETERS) ×1 IMPLANT
CATH ROBINSON RED A/P 16FR (CATHETERS) ×1 IMPLANT
CATH URETL 5X70 OPEN END (CATHETERS) ×3 IMPLANT
CNTNR SPEC 2.5X3XGRAD LEK (MISCELLANEOUS) ×2
CONT SPEC 4OZ STER OR WHT (MISCELLANEOUS) ×1
CONT SPEC 4OZ STRL OR WHT (MISCELLANEOUS) ×2
CONTAINER SPEC 2.5X3XGRAD LEK (MISCELLANEOUS) IMPLANT
DRAPE UTILITY 15X26 TOWEL STRL (DRAPES) ×3 IMPLANT
FIBER LASER LITHO 273 (Laser) ×1 IMPLANT
GLIDEWIRE STIFF .35X180X3 HYDR (WIRE) ×1 IMPLANT
GLOVE BIO SURGEON STRL SZ 6.5 (GLOVE) ×3 IMPLANT
GOWN STRL REUS W/ TWL LRG LVL3 (GOWN DISPOSABLE) ×4 IMPLANT
GOWN STRL REUS W/TWL LRG LVL3 (GOWN DISPOSABLE) ×6
GUIDEWIRE GREEN .038 145CM (MISCELLANEOUS) ×1 IMPLANT
GUIDEWIRE STR DUAL SENSOR (WIRE) ×3 IMPLANT
INFUSOR MANOMETER BAG 3000ML (MISCELLANEOUS) ×3 IMPLANT
INTRODUCER DILATOR DOUBLE (INTRODUCER) ×1 IMPLANT
KIT TURNOVER CYSTO (KITS) ×3 IMPLANT
PACK CYSTO AR (MISCELLANEOUS) ×3 IMPLANT
SET CYSTO W/LG BORE CLAMP LF (SET/KITS/TRAYS/PACK) ×3 IMPLANT
SHEATH URETERAL 12FRX35CM (MISCELLANEOUS) IMPLANT
SOL .9 NS 3000ML IRR  AL (IV SOLUTION) ×1
SOL .9 NS 3000ML IRR AL (IV SOLUTION) ×2
SOL .9 NS 3000ML IRR UROMATIC (IV SOLUTION) ×2 IMPLANT
STENT URET 6FRX24 CONTOUR (STENTS) IMPLANT
STENT URET 6FRX26 CONTOUR (STENTS) ×1 IMPLANT
SURGILUBE 2OZ TUBE FLIPTOP (MISCELLANEOUS) ×3 IMPLANT
SYR 10ML LL (SYRINGE) ×1 IMPLANT
WATER STERILE IRR 1000ML POUR (IV SOLUTION) ×3 IMPLANT

## 2019-06-06 NOTE — Discharge Instructions (Signed)
You have a ureteral stent in place.  This is a tube that extends from your kidney to your bladder.  This may cause urinary bleeding, burning with urination, and urinary frequency.  Please call our office or present to the ED if you develop fevers >101 or pain which is not able to be controlled with oral pain medications.  You may be given either Flomax and/ or ditropan to help with bladder spasms and stent pain in addition to pain medications.    Your stent is on a string which is attached to the head of your penis.  On Friday morning, you may untaped this and pulled gently until the entire stent is removed.  If you have any difficulties or would like to come into the office for assistance removing this, please call us and let us know.  Please take care when voiding (using the bathroom) not to accidentally dislodge her stent.  [facility] [address] [place], [place] [address] [phone]    AMBULATORY SURGERY  DISCHARGE INSTRUCTIONS   1) The drugs that you were given will stay in your system until tomorrow so for the next 24 hours you should not:  A) Drive an automobile B) Make any legal decisions C) Drink any alcoholic beverage   2) You may resume regular meals tomorrow.  Today it is better to start with liquids and gradually work up to solid foods.  You may eat anything you prefer, but it is better to start with liquids, then soup and crackers, and gradually work up to solid foods.   3) Please notify your doctor immediately if you have any unusual bleeding, trouble breathing, redness and pain at the surgery site, drainage, fever, or pain not relieved by medication.    4) Additional Instructions:        Please contact your physician with any problems or Same Day Surgery at [phone], Monday through Friday 6 am to 4 pm, or [place] at [facility] number at [phone].

## 2019-06-06 NOTE — Op Note (Signed)
Date of procedure: 06/06/19  Preoperative diagnosis:  1. Right ureteral stricture 2. Right hydroureteronephrosis 3. Right ureteral calculus 4. BPH 5. History of prostate cancer  Postoperative diagnosis:  1. Same as above  Procedure: 1. Right ureteral stent exchange 2. Right ureteroscopy with laser lithotripsy 3. Right retrograde pyelogram 4. Basket extraction of stone fragment  Surgeon: [name], MD  Anesthesia: General  Complications: None  Intraoperative findings: Heavily encrusted Bard optima stent, unable to cannulate.  Persistent severe right hydroureteronephrosis down to just beyond the iliacs without intraluminal obstruction.  8 mm stones identified and obliterated.  Difficult procedure due to tortuosity of the ureter with significant dilation, massive prostate.  EBL: Minimal  Specimens: Stone fragment  Drains: 6 x 26 French double-J ureteral stent on the right with tether left in place  Indication: Javier Watson is a 73 y.o. patient with chronic right hydroureteronephrosis with an incidental ureteral calculus near the level of the transition.  After reviewing the management options for treatment, he elected to proceed with the above surgical procedure(s). We have discussed the potential benefits and risks of the procedure, side effects of the proposed treatment, the likelihood of the patient achieving the goals of the procedure, and any potential problems that might occur during the procedure or recuperation. Informed consent has been obtained.  Description of procedure:  The patient was taken to the operating room and general anesthesia was induced.  The patient was placed in the dorsal lithotomy position, prepped and draped in the usual sterile fashion, and preoperative antibiotics were administered. A preoperative time-out was performed.   21 [place] scope was advanced per urethra into the bladder.  Notably, the prostate was markedly enlarged with an elevated  bladder neck somewhat obstructing the trigone.  Stent was seen emanating from the right distal ureter.  This was fairly heavily encrusted despite being a Bard optima stent.  The distal coil was grasped and on attempts to pull to the urethral meatus, there was some difficulty due to the degree of encrustation.  I was able to pull it easily about halfway down the ureter and then it became lodged.  I reintroduced the scope and grasped the stent more proximally near the UO again and pulled very gently until ultimately I was able to bring the distal coil to level the urethral meatus.  Unfortunately, I was unable to cannulate the stent due to encrustation of the lumen.  I went ahead and remove the stent under fluoroscopic guidance which notably had some blood encrustation along the entire length of the stent.  I then readvanced the scope with attention turned toward the right ureteral orifice.  An open-ended ureteral catheter was advanced just within the UO and a gentle retrograde pyelogram was performed.  This indicated a persistent severe distal ureteral stricture with considerable tortuosity of the proximal ureter with severe dilation.  The renal pelvis itself did not appear to be dilated and the calyces appeared to be more decompressed than previously appreciated.  Unfortunately, due to the considerable tortuosity, difficult time advancing the wire up to the renal pelvis.  And then attempted an angled Glidewire and ultimately using a series of maneuvers using an angled Glidewire and open-ended ureteral catheter was able to get the wire up to the level of the mid/proximal ureter where there was 180 degree bend.  Ultimately, this was snapped in place and I ended up advancing a 4.5 semirigid ureteroscope alongside of this wire and ultimately was able to advance a wire to the  renal pelvis under direct vision.  This was then snapped in place as a safety wire.  Notably, the stone was encountered in the distal ureter near  the transition point in the ureter.  Upon removing the semirigid ureteroscope to exclude the safety wire, and had difficulty re-advancing through the UO alongside of the wire.  I then used a dual-lumen access sheath to the level of the mid ureter which strained the ureter somewhat and I was able to advance a 7 [place] digital flexible ureteroscope up to level the renal pelvis.  The renal pelvis was then directly visualized and each every calyx was identified.  No significant stone material was identified other than some layering of debris in the dependent lower pole.  I then backed the scope down the length the ureter inspecting along the way.  It was massively dilated and inspection was tedious and careful.  I did encounter some matrix-like material which is obliterated using a 273 micro-laser fiber the settings of 0.2 J and 40 Hz, eventually, I encountered the large 8 mm stone near the level of the transition of the distal ureter.  I was able to fractionate the stone into 20 or 30 smaller pieces however basket extraction did not appear to be possible given the inability to advance the scope in and out of the distal portion of the ureter easily.  As such, I used a 1.9 tipless nitinol basket to extract 3 of the small pieces at once leaving the remainder of the very small pieces, approximately 1 to 2 mm within the distal ureter.  These were passed off the field of stone fragment.  And then attempted to be advance the scope but due to concern for scope trauma and extensive manipulation, the decision was made to leave the small fragments in place.  I then backloaded the safety wire over the rigid cystoscope.  A 6 x 26 French double-J ureteral stent was then advanced up to level the kidney.  Just prior to placing the stent, I did perform 1 last retrograde pyelogram using a dual-lumen access sheath to ensure that there was no ureteral injury or extravasation is also outlined the renal pelvis.  The stent was then advanced  into the kidney and a full coil was noted with within the renal pelvis as well is within the bladder.  The bladder was drained.  I did elect to leave the stent string affixed to the distal coil of the stent due to concern for copious amount encrustation previously.  I will have him remove his stent on Friday.  As previously discussed, he is opted for conservative management.  Will likely repeat a Lasix renogram down the road to assess his drainage.  The string was affixed the patient's glans using Mastisol and Tegaderm.  He was then clean and dry, repositioned in supine position, reversed from anesthesia taken the PACU in stable condition.  Plan: He will remove his onset of Friday.  We will have him follow-up in 6 weeks.  We will order Lasix renogram thereafter.  All questions answered.  [name], M.D.

## 2019-06-06 NOTE — Interval H&P Note (Signed)
RRR CTAB  On macrobid   No changes from h and p

## 2019-06-06 NOTE — Anesthesia Preprocedure Evaluation (Addendum)
Anesthesia Evaluation  Patient identified by MRN, date of birth, ID band Patient awake    Reviewed: Allergy & Precautions, NPO status , Patient's Chart, lab work & pertinent test results, reviewed documented beta blocker date and time   Airway Mallampati: III  TM Distance: >3 FB     Dental  (+) Chipped   Pulmonary sleep apnea and Continuous Positive Airway Pressure Ventilation ,           Cardiovascular hypertension, Pt. on medications + Past MI  + dysrhythmias Supra Ventricular Tachycardia      Neuro/Psych PSYCHIATRIC DISORDERS Anxiety Depression    GI/Hepatic GERD  Controlled,  Endo/Other  diabetes, Type 2  Renal/GU Renal disease     Musculoskeletal  (+) Arthritis ,   Abdominal   Peds  Hematology   Anesthesia Other Findings Obese. Denies MI. Skull fracture, without brain injury.  Reproductive/Obstetrics                            Anesthesia Physical Anesthesia Plan  ASA: III  Anesthesia Plan: General   Post-op Pain Management:    Induction: Intravenous  PONV Risk Score and Plan:   Airway Management Planned: Oral ETT  Additional Equipment:   Intra-op Plan:   Post-operative Plan:   Informed Consent: I have reviewed the patients History and Physical, chart, labs and discussed the procedure including the risks, benefits and alternatives for the proposed anesthesia with the patient or authorized representative who has indicated his/her understanding and acceptance.       Plan Discussed with: CRNA  Anesthesia Plan Comments:         Anesthesia Quick Evaluation

## 2019-06-06 NOTE — Anesthesia Procedure Notes (Signed)
Procedure Name: Intubation Performed by: Caryl Asp, CRNA Pre-anesthesia Checklist: Patient identified, Patient being monitored, Timeout performed, Emergency Drugs available and Suction available Patient Re-evaluated:Patient Re-evaluated prior to induction Oxygen Delivery Method: Circle system utilized Preoxygenation: Pre-oxygenation with 100% oxygen Induction Type: IV induction and Rapid sequence Ventilation: Mask ventilation without difficulty Laryngoscope Size: Miller and 2 Grade View: Grade II Tube type: Oral Tube size: 7.5 mm Number of attempts: 1 Airway Equipment and Method: Stylet Placement Confirmation: ETT inserted through vocal cords under direct vision,  positive ETCO2 and breath sounds checked- equal and bilateral Secured at: 22 cm Tube secured with: Tape Dental Injury: Teeth and Oropharynx as per pre-operative assessment

## 2019-06-06 NOTE — Transfer of Care (Signed)
Immediate Anesthesia Transfer of Care Note  Patient: CHARVEZ VOORHIES  Procedure(s) Performed: CYSTOSCOPY/URETEROSCOPY/HOLMIUM LASER/STENT Exchange (Right Ureter) CYSTOSCOPY WITH RETROGRADE PYELOGRAM (Right Ureter) STONE EXTRACTION WITH BASKET  Patient Location: PACU  Anesthesia Type:General  Level of Consciousness: sedated  Airway & Oxygen Therapy: Patient Spontanous Breathing and Patient connected to face mask oxygen  Post-op Assessment: Report given to RN and Post -op Vital signs reviewed and stable  Post vital signs: Reviewed and stable  Last Vitals:  Vitals Value Taken Time  BP 150/68 06/06/19 1628  Temp    Pulse 72 06/06/19 1630  Resp 11 06/06/19 1630  SpO2 99 % 06/06/19 1630  Vitals shown include unvalidated device data.  Last Pain:  Vitals:   06/06/19 1203  TempSrc: Oral  PainSc: 0-No pain         Complications: No apparent anesthesia complications

## 2019-06-06 NOTE — Anesthesia Post-op Follow-up Note (Signed)
Anesthesia QCDR form completed.        

## 2019-06-07 NOTE — Anesthesia Postprocedure Evaluation (Signed)
Anesthesia Post Note  Patient: ELISON WORREL  Procedure(s) Performed: CYSTOSCOPY/URETEROSCOPY/HOLMIUM LASER/STENT Exchange (Right Ureter) CYSTOSCOPY WITH RETROGRADE PYELOGRAM (Right Ureter) STONE EXTRACTION WITH BASKET  Patient location during evaluation: PACU Anesthesia Type: General Level of consciousness: awake and alert Pain management: pain level controlled Vital Signs Assessment: post-procedure vital signs reviewed and stable Respiratory status: spontaneous breathing, nonlabored ventilation, respiratory function stable and patient connected to nasal cannula oxygen Cardiovascular status: blood pressure returned to baseline and stable Postop Assessment: no apparent nausea or vomiting Anesthetic complications: no     Last Vitals:  Vitals:   06/06/19 1720 06/06/19 1804  BP: (!) 148/67 135/64  Pulse: 76 76  Resp: 18 16  Temp: (!) 36.2 C   SpO2: 94% 96%    Last Pain:  Vitals:   06/06/19 1804  TempSrc:   PainSc: 2                  Chaunce Winkels S

## 2019-06-18 ENCOUNTER — Other Ambulatory Visit: Payer: Self-pay | Admitting: Urology

## 2019-06-23 LAB — CALCULI, WITH PHOTOGRAPH (CLINICAL LAB)
Calcium Oxalate Dihydrate: 30 %
Calcium Oxalate Monohydrate: 70 %
Weight Calculi: 6 mg

## 2019-07-15 ENCOUNTER — Other Ambulatory Visit: Payer: Self-pay | Admitting: Urology

## 2019-07-20 ENCOUNTER — Encounter: Payer: Self-pay | Admitting: Urology

## 2019-07-20 ENCOUNTER — Ambulatory Visit (INDEPENDENT_AMBULATORY_CARE_PROVIDER_SITE_OTHER): Payer: 59 | Admitting: Urology

## 2019-07-20 ENCOUNTER — Other Ambulatory Visit: Payer: Self-pay

## 2019-07-20 VITALS — BP 106/49 | HR 56 | Ht 68.0 in | Wt 232.0 lb

## 2019-07-20 DIAGNOSIS — N1330 Unspecified hydronephrosis: Secondary | ICD-10-CM | POA: Diagnosis not present

## 2019-07-20 DIAGNOSIS — C61 Malignant neoplasm of prostate: Secondary | ICD-10-CM

## 2019-07-20 DIAGNOSIS — N183 Chronic kidney disease, stage 3 unspecified: Secondary | ICD-10-CM

## 2019-07-20 DIAGNOSIS — N201 Calculus of ureter: Secondary | ICD-10-CM | POA: Diagnosis not present

## 2019-07-20 DIAGNOSIS — N200 Calculus of kidney: Secondary | ICD-10-CM

## 2019-07-20 DIAGNOSIS — N131 Hydronephrosis with ureteral stricture, not elsewhere classified: Secondary | ICD-10-CM

## 2019-07-20 NOTE — Progress Notes (Signed)
07/20/2019 3:09 PM   Javier Watson 22-May-1946 [id number]  Referring provider: No referring provider defined for this encounter.  Chief Complaint  Patient presents with  . Routine Post Op    HPI: 73 year old male with multiple GU issues who returns today for routine follow-up following right ureteroscopy on 06/06/2019.  Please see previous notes for details.  He has a known idiopathic right distal ureteral stricture, high-grade previously diagnosed at the [facility] for under which he was undergoing surveillance and serial Lasix renal scans.  He developed a stone just proximal to this narrowing and underwent staged ureteroscopy most recently in late July for definitive management of the stone which was treated successfully.  He removed his own stent subsequently without complication.  He is overall doing well today.  He denies any flank pain or urinary symptoms at baseline.  In addition to the above, he has a personal history of prostate cancer on surveillance.  Again, see previous notes for details.   Notably, his PSA has been rising, most recently up to 10.5 in 05/2019.   PMH: Past Medical History:  Diagnosis Date  . Anxiety   . Arthritis   . Cancer ([place])    PROSTATE  . Chronic kidney disease    STAGE 3  . 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 Depression   . Diabetes mellitus without complication ([place])    patient follows diet only. no medications  . Dysrhythmia    irregular heart beat  . Essential hypertension   . History of kidney stones   . Keratoderma   . Post traumatic stress disorder (PTSD)   . Sleep apnea    C-PAP    Surgical History: Past Surgical History:  Procedure Laterality Date  . Arm Surgery    . BACK SURGERY  1967   Laminectomy on skull of neck  . COLONOSCOPY    . CRANIOTOMY  1967  . CYSTOSCOPY W/ RETROGRADES Right 06/06/2019   Procedure: CYSTOSCOPY WITH RETROGRADE PYELOGRAM;  Surgeon: [name], MD;  Location: [facility];  Service: Urology;   Laterality: Right;  . CYSTOSCOPY WITH URETEROSCOPY AND STENT PLACEMENT Right 01/2016  . CYSTOSCOPY/URETEROSCOPY/HOLMIUM LASER/STENT PLACEMENT Right 05/09/2019   Procedure: CYSTOSCOPY/URETEROSCOPY/STENT PLACEMENT;  Surgeon: [name], MD;  Location: [facility];  Service: Urology;  Laterality: Right;  . CYSTOSCOPY/URETEROSCOPY/HOLMIUM LASER/STENT PLACEMENT Right 06/06/2019   Procedure: CYSTOSCOPY/URETEROSCOPY/HOLMIUM LASER/STENT Exchange;  Surgeon: [name], MD;  Location: [facility];  Service: Urology;  Laterality: Right;  . HYDROCELE EXCISION Right 10/21/2016   Procedure: HYDROCELECTOMY ADULT;  Surgeon: [name], MD;  Location: [facility];  Service: Urology;  Laterality: Right;  . ORTHOPEDIC SURGERY    . STONE EXTRACTION WITH BASKET  06/06/2019   Procedure: STONE EXTRACTION WITH BASKET;  Surgeon: [name], MD;  Location: [facility];  Service: Urology;;  . TOTAL HIP ARTHROPLASTY Left 2006  . URETEROSCOPY Right 04/2016   stent removal- spesis    Home Medications:  Allergies as of 07/20/2019   No Known Allergies     Medication List       Accurate as of July 20, 2019 11:59 PM. If you have any questions, ask your nurse or doctor.        STOP taking these medications   ampicillin 500 MG capsule Commonly known as: PRINCIPEN Stopped by: [name], MD   nitrofurantoin (macrocrystal-monohydrate) 100 MG capsule Commonly known as: Macrobid Stopped by: [name], MD     TAKE these medications   ammonium lactate 12 %  lotion Commonly known as: LAC-HYDRIN   aspirin 81 MG chewable tablet Chew 1 tablet (81 mg total) by mouth daily.   buPROPion 100 MG 12 hr tablet Commonly known as: WELLBUTRIN SR Take 200 mg by mouth 2 (two) times daily.   citalopram 40 MG tablet Commonly known as: CELEXA Take 40 mg by mouth every morning.   diltiazem 360 MG 24 hr capsule Commonly known as: TIAZAC Take 360 mg by mouth every morning.   docusate sodium 100 MG capsule  Commonly known as: COLACE Take 1 capsule (100 mg total) by mouth 2 (two) times daily. What changed: when to take this   Fish Oil 1000 MG Caps Take 1,000 mg by mouth 3 (three) times a week.   hydrALAZINE 25 MG tablet Commonly known as: APRESOLINE Take 25 mg by mouth 3 (three) times daily.   HYDROcodone-acetaminophen 5-325 MG tablet Commonly known as: NORCO/VICODIN Take 1-2 tablets by mouth every 6 (six) hours as needed for moderate pain.   losartan 100 MG tablet Commonly known as: COZAAR Take 1 tablet (100 mg total) by mouth at bedtime.   multivitamin with minerals Tabs tablet Take 1 tablet by mouth 3 (three) times a week.   oxybutynin 5 MG tablet Commonly known as: DITROPAN Take 1 tablet (5 mg total) by mouth every 8 (eight) hours as needed for bladder spasms.   oxycodone 5 MG capsule Commonly known as: OXY-IR Take 1 capsule (5 mg total) by mouth every 4 (four) hours as needed for pain.   potassium chloride SA 20 MEQ tablet Commonly known as: KLOR-CON Take 1 tablet (20 mEq total) by mouth daily. What changed: how much to take   pravastatin 40 MG tablet Commonly known as: PRAVACHOL   prazosin 5 MG capsule Commonly known as: MINIPRESS Take 5 mg by mouth at bedtime.   tamsulosin 0.4 MG Caps capsule Commonly known as: FLOMAX TAKE 1 CAPSULE BY MOUTH EVERY DAY   traZODone 50 MG tablet Commonly known as: DESYREL       Allergies: No Known Allergies  Family History: Family History  Problem Relation Age of Onset  . Hypertension Father   . Bladder Cancer Neg Hx   . Prostate cancer Neg Hx   . Kidney cancer Neg Hx     Social History:  reports that he has never smoked. He has never used smokeless tobacco. He reports that he does not drink alcohol or use drugs.  ROS: UROLOGY Frequent Urination?: No Hard to postpone urination?: No Burning/pain with urination?: No Get up at night to urinate?: No Leakage of urine?: No Urine stream starts and stops?: No Trouble  starting stream?: No Do you have to strain to urinate?: No Blood in urine?: No Urinary tract infection?: No Sexually transmitted disease?: No Injury to kidneys or bladder?: No Painful intercourse?: No Weak stream?: No Erection problems?: No Penile pain?: No  Gastrointestinal Nausea?: No Vomiting?: No Indigestion/heartburn?: No Diarrhea?: No Constipation?: No  Constitutional Fever: No Night sweats?: No Weight loss?: No Fatigue?: No  Skin Skin rash/lesions?: No Itching?: No  Eyes Blurred vision?: No Double vision?: No  Ears/Nose/Throat Sore throat?: No Sinus problems?: No  Hematologic/Lymphatic Swollen glands?: No Easy bruising?: No  Cardiovascular Leg swelling?: No Chest pain?: No  Respiratory Cough?: No Shortness of breath?: No  Endocrine Excessive thirst?: No  Musculoskeletal Back pain?: No Joint pain?: No  Neurological Headaches?: No Dizziness?: No  Psychologic Depression?: No Anxiety?: No  Physical Exam: BP (!) 106/49   Pulse (!) 56  Ht 5\' 8"  (1.727 m)   Wt 232 lb (105.2 kg)   BMI 35.28 kg/m   Constitutional:  Alert and oriented, No acute distress. HEENT: [place] AT, moist mucus membranes.  Trachea midline, no masses. Cardiovascular: No clubbing, cyanosis, or edema. Respiratory: Normal respiratory effort, no increased work of breathing. Skin: No rashes, bruises or suspicious lesions. Neurologic: Grossly intact, no focal deficits, moving all 4 extremities. Psychiatric: Normal mood and affect.  Laboratory Data: Lab Results  Component Value Date   WBC 5.4 04/25/2019   HGB 14.3 05/09/2019   HCT 42.0 05/09/2019   MCV 86.2 04/25/2019   PLT 170 04/25/2019    Lab Results  Component Value Date   CREATININE 1.64 (H) 05/19/2019   Lab Results  Component Value Date   HGBA1C 6.2 (H) 05/15/2016     Assessment & Plan:    1. Right ureteral stone Status post staged right ureteroscopy Stone was successfully treated and stent removed  Typically, we follow-up with renal ultrasound postoperatively but given the history of right hydronephrosis and ureteral obstruction from his stricture which being managed conservative, if this is low yield  As per previous notes, we will continue to follow this conservatively as was done at the [place].  Would like to obtain a Lasix renogram both to assess his renal function as well as degree of obstruction and to serve as a baseline at [facility].  He is agreeable this plan.  2. Hydronephrosis, right Secondary to idiopathic right distal ureteral stricture  3. Nephrolithiasis Status post ureteroscopy, likely secondary to impaired drainage  4. Prostate cancer ([place]) Percentage of low risk prostate cancer and active surveillance  In light of rising PSA, strongly recommend a prostate MRI for further evaluation.  Notably, he does have significant BPH, previous truss volume 160 g.  This will help identify high-grade lesions and serve as a template for fusion biopsy if this is warranted.  He is agreeable this plan  5. CKD (chronic kidney disease) stage 3, GFR 30-59 ml/min (HCC) Creatinine at baseline   Return in about 3 months (around 10/19/2019) for lasix scan, prostate MR.  [name], MD  [facility] [address] [place], [place] [address] [phone]

## 2019-10-19 ENCOUNTER — Ambulatory Visit: Payer: 59

## 2019-10-21 ENCOUNTER — Ambulatory Visit
Admission: RE | Admit: 2019-10-21 | Discharge: 2019-10-21 | Disposition: A | Discharge status: Home / Self Care | Payer: 59 | Source: Ambulatory Visit | Attending: Urology | Admitting: Urology

## 2019-10-21 ENCOUNTER — Other Ambulatory Visit: Payer: Self-pay

## 2019-10-21 DIAGNOSIS — N131 Hydronephrosis with ureteral stricture, not elsewhere classified: Secondary | ICD-10-CM

## 2019-10-21 MED ORDER — TECHNETIUM TC 99M MEDRONATE IV KIT: 20.0000 | PACK | Freq: Once | INTRAVENOUS | Status: DC | PRN

## 2019-10-21 MED ORDER — FUROSEMIDE 10 MG/ML IJ SOLN
52.6000 mg | Freq: Once | INTRAMUSCULAR | Status: AC
  Administered 2019-10-21: 52.6 mg via INTRAVENOUS
  Filled 2019-10-21: qty 5.3

## 2019-10-21 MED ORDER — TECHNETIUM TC 99M MERTIATIDE
5.0000 | Freq: Once | INTRAVENOUS | Status: AC | PRN
  Administered 2019-10-21: 5.379 via INTRAVENOUS

## 2019-10-26 ENCOUNTER — Ambulatory Visit: Admit: 2019-10-26 | Payer: Non-veteran care | Admitting: Urology

## 2019-10-26 ENCOUNTER — Ambulatory Visit: Payer: 59 | Admitting: Urology

## 2019-10-26 SURGERY — CYSTOSCOPY
Anesthesia: Choice | Laterality: Right

## 2019-10-28 ENCOUNTER — Other Ambulatory Visit: Payer: Self-pay

## 2019-10-28 ENCOUNTER — Ambulatory Visit (HOSPITAL_COMMUNITY)
Admission: RE | Admit: 2019-10-28 | Discharge: 2019-10-28 | Disposition: A | Discharge status: Home / Self Care | Payer: 59 | Source: Ambulatory Visit | Attending: Urology | Admitting: Urology

## 2019-10-28 DIAGNOSIS — C61 Malignant neoplasm of prostate: Secondary | ICD-10-CM | POA: Diagnosis not present

## 2019-10-28 LAB — POCT I-STAT CREATININE: Creatinine, Ser: 1.7 mg/dL — ABNORMAL HIGH (ref 0.61–1.24)

## 2019-10-28 MED ORDER — LIDOCAINE HCL URETHRAL/MUCOSAL 2 % EX GEL
CUTANEOUS | Status: AC
  Filled 2019-10-28: qty 30

## 2019-10-28 MED ORDER — GADOBUTROL 1 MMOL/ML IV SOLN
10.0000 mL | Freq: Once | INTRAVENOUS | Status: AC | PRN
  Administered 2019-10-28: 10 mL via INTRAVENOUS

## 2019-11-02 ENCOUNTER — Encounter: Payer: Self-pay | Admitting: Urology

## 2019-11-02 ENCOUNTER — Ambulatory Visit (INDEPENDENT_AMBULATORY_CARE_PROVIDER_SITE_OTHER): Payer: 59 | Admitting: Urology

## 2019-11-02 ENCOUNTER — Other Ambulatory Visit: Payer: Self-pay

## 2019-11-02 VITALS — BP 177/76 | HR 76 | Ht 68.0 in | Wt 232.0 lb

## 2019-11-02 DIAGNOSIS — N131 Hydronephrosis with ureteral stricture, not elsewhere classified: Secondary | ICD-10-CM

## 2019-11-03 NOTE — Progress Notes (Signed)
I was called urgently to the operating room and thus was unable to see Mr. [place] today.  We will invite him to reschedule.  [name], MD

## 2019-11-22 ENCOUNTER — Other Ambulatory Visit: Payer: Self-pay

## 2019-11-22 ENCOUNTER — Encounter: Payer: Self-pay | Admitting: Urology

## 2019-11-22 ENCOUNTER — Ambulatory Visit: Payer: 59 | Admitting: Urology

## 2019-11-22 VITALS — BP 152/84 | HR 88 | Ht 68.0 in | Wt 243.0 lb

## 2019-11-22 DIAGNOSIS — N400 Enlarged prostate without lower urinary tract symptoms: Secondary | ICD-10-CM

## 2019-11-22 DIAGNOSIS — N131 Hydronephrosis with ureteral stricture, not elsewhere classified: Secondary | ICD-10-CM

## 2019-11-22 DIAGNOSIS — N183 Chronic kidney disease, stage 3 unspecified: Secondary | ICD-10-CM

## 2019-11-22 DIAGNOSIS — C61 Malignant neoplasm of prostate: Secondary | ICD-10-CM | POA: Diagnosis not present
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
## 2019-11-23 LAB — PSA: Prostate Specific Ag, Serum: 9.6 ng/mL — ABNORMAL HIGH (ref 0.0–4.0)

## 2020-05-15 ENCOUNTER — Telehealth: Payer: Self-pay | Admitting: *Deleted

## 2020-05-15 DIAGNOSIS — C61 Malignant neoplasm of prostate: Secondary | ICD-10-CM

## 2020-05-15 NOTE — Telephone Encounter (Signed)
Needs PSA drawn prior to appointment 05/22/20. Left VM to return my call.

## 2020-05-15 NOTE — Addendum Note (Signed)
Addended by: Verlene Mayer A on: 05/15/2020 11:50 AM   Modules accepted: Orders

## 2020-05-18 ENCOUNTER — Other Ambulatory Visit: Payer: Self-pay

## 2020-05-18 ENCOUNTER — Other Ambulatory Visit: Payer: 59

## 2020-05-18 DIAGNOSIS — C61 Malignant neoplasm of prostate: Secondary | ICD-10-CM

## 2020-05-18 NOTE — Telephone Encounter (Signed)
Pt called back and I have him scheduled for PSA to be drawn today.

## 2020-05-19 LAB — PSA: Prostate Specific Ag, Serum: 10.5 ng/mL — ABNORMAL HIGH (ref 0.0–4.0)

## 2020-05-19 NOTE — Progress Notes (Signed)
05/22/2020 1:36 PM   Javier Watson 31-Oct-1946 [id number]  Referring provider: [facility], [facility] [address] [place],  [place] [address] Chief Complaint  Patient presents with  . Prostate Cancer    48mo follow up    HPI: Javier Watson is a 74 y.o. male with multiple GU issues who returns today for 6 month follow-up.   He was previously followed at the [facility] now was transferred all of his urologic care to [place].  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  He recently was treated for a stone near the level of the obstruction successfully.  Hydronephrosis on the side is severe and chronic.  He has had several Lasix renal scans including 2017 at the [facility] which showed prompt excretion of contrast material without a functional obstruction.  Lasix scan was repeated by me on 10/21/2019 which shows his right kidney at 44% function and prompt excretion of contrast material.    He denies any urinary symptoms.  He has a personal history of prostate cancer on active surveillance. He has had MRIs at the [place] in the past.    Prostate MRI from 11/2019 shows significant artifact but no obvious evidence of extracapsular extension. This revealed significant prostamegaly, estimated prostatic volume of 217 without evidence of advanced or metastatic disease.   Patient has a personal history of hydrocele status post hydrocelectomy.  PSA was 10.5 (elevated) on 05/18/2020.  Patient recently had a wisdom tooth removed.   Patient reports urinating like a "champ".  He is a good stream, no nocturia urgency or frequency.  No dysuria or gross hematuria.  He has started back going to the gym. He notes weight loss. His bowels are normal.   PSA trend: 10.5 05/18/20 9.4 03/09/2018 8.17 02/05/2016 3.53 04/30/2011 3.0 09/18/2010 3.154 2010 3.0 3/27/2009TRUS vol 160 g    PMH: Past Medical History:  Diagnosis Date  . Anxiety   . Arthritis   . Cancer ([place])    PROSTATE  . Chronic kidney disease     STAGE 3  . 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 Depression   . Diabetes mellitus without complication ([place])    patient follows diet only. no medications  . Dysrhythmia    irregular heart beat  . Essential hypertension   . History of kidney stones   . Keratoderma   . Post traumatic stress disorder (PTSD)   . Sleep apnea    C-PAP    Surgical History: Past Surgical History:  Procedure Laterality Date  . Arm Surgery    . BACK SURGERY  1967   Laminectomy on skull of neck  . COLONOSCOPY    . CRANIOTOMY  1967  . CYSTOSCOPY W/ RETROGRADES Right 06/06/2019   Procedure: CYSTOSCOPY WITH RETROGRADE PYELOGRAM;  Surgeon: [name], MD;  Location: [facility];  Service: Urology;  Laterality: Right;  . CYSTOSCOPY WITH URETEROSCOPY AND STENT PLACEMENT Right 01/2016  . CYSTOSCOPY/URETEROSCOPY/HOLMIUM LASER/STENT PLACEMENT Right 05/09/2019   Procedure: CYSTOSCOPY/URETEROSCOPY/STENT PLACEMENT;  Surgeon: [name], MD;  Location: [facility];  Service: Urology;  Laterality: Right;  . CYSTOSCOPY/URETEROSCOPY/HOLMIUM LASER/STENT PLACEMENT Right 06/06/2019   Procedure: CYSTOSCOPY/URETEROSCOPY/HOLMIUM LASER/STENT Exchange;  Surgeon: [name], MD;  Location: [facility];  Service: Urology;  Laterality: Right;  . HYDROCELE EXCISION Right 10/21/2016   Procedure: HYDROCELECTOMY ADULT;  Surgeon: [name], MD;  Location: [facility];  Service: Urology;  Laterality: Right;  . ORTHOPEDIC SURGERY    . STONE EXTRACTION WITH BASKET  06/06/2019   Procedure: STONE EXTRACTION WITH BASKET;  Surgeon: [name], MD;  Location: [facility];  Service: Urology;;  . TOTAL HIP ARTHROPLASTY Left 2006  . URETEROSCOPY Right 04/2016   stent removal- spesis    Home Medications:  Allergies as of 05/22/2020   No Known Allergies     Medication List       Accurate as of May 22, 2020  1:36 PM. If you have any questions, ask your nurse or doctor.        ammonium lactate 12 % lotion Commonly known as:  LAC-HYDRIN   aspirin 81 MG chewable tablet Chew 1 tablet (81 mg total) by mouth daily.   aspirin 81 MG EC tablet Take by mouth.   buPROPion 100 MG 12 hr tablet Commonly known as: WELLBUTRIN SR Take 200 mg by mouth 2 (two) times daily.   chlorthalidone 50 MG tablet Commonly known as: HYGROTON Take by mouth.   citalopram 40 MG tablet Commonly known as: CELEXA Take 40 mg by mouth every morning.   diltiazem 360 MG 24 hr capsule Commonly known as: TIAZAC Take 360 mg by mouth every morning.   docusate sodium 100 MG capsule Commonly known as: COLACE Take 1 capsule (100 mg total) by mouth 2 (two) times daily. What changed: when to take this   Fish Oil 1000 MG Caps Take 1,000 mg by mouth 3 (three) times a week.   hydrALAZINE 25 MG tablet Commonly known as: APRESOLINE Take 25 mg by mouth 3 (three) times daily.   HYDROcodone-acetaminophen 5-325 MG tablet Commonly known as: NORCO/VICODIN Take 1-2 tablets by mouth every 6 (six) hours as needed for moderate pain.   hydrocortisone 25 MG suppository Commonly known as: ANUSOL-HC Place rectally.   losartan 100 MG tablet Commonly known as: COZAAR Take 1 tablet (100 mg total) by mouth at bedtime.   multivitamin with minerals Tabs tablet Take 1 tablet by mouth 3 (three) times a week.   oxyCODONE 5 MG immediate release tablet Commonly known as: Oxy IR/ROXICODONE Take 5 mg by mouth 2 (two) times daily as needed. What changed: Another medication with the same name was removed. Continue taking this medication, and follow the directions you see here. Changed by: [name], MD   potassium chloride SA 20 MEQ tablet Commonly known as: KLOR-CON Take 1 tablet (20 mEq total) by mouth daily. What changed: how much to take   pravastatin 40 MG tablet Commonly known as: PRAVACHOL   prazosin 5 MG capsule Commonly known as: MINIPRESS Take 5 mg by mouth at bedtime.   traZODone 50 MG tablet Commonly known as: DESYREL        Allergies: No Known Allergies  Family History: Family History  Problem Relation Age of Onset  . Hypertension Father   . Bladder Cancer Neg Hx   . Prostate cancer Neg Hx   . Kidney cancer Neg Hx     Social History:  reports that he has never smoked. He has never used smokeless tobacco. He reports that he does not drink alcohol and does not use drugs.   Physical Exam: BP (!) 163/75   Pulse (!) 45   Ht 5\' 7"  (1.702 m)   Wt 241 lb (109.3 kg)   BMI 37.75 kg/m   Constitutional:  Alert and oriented, No acute distress. HEENT:  AT, moist mucus membranes.  Trachea midline, no masses. Cardiovascular: No clubbing, cyanosis, or edema. Respiratory: Normal respiratory effort, no increased work of breathing. Rectal: Sphincter tone, prostate, nodules/tenderness  Skin: No rashes, bruises or suspicious lesions. Neurologic: Grossly intact, no focal deficits, moving all 4 extremities. Psychiatric: Normal mood and affect.    Lab Results  Component Value Date   HGBA1C 6.2 (H) 05/15/2016      Assessment & Plan:    1. Hydronephrosis with ureteral stricture, not elsewhere classified Chronic Lasix scan reassuring with prompt excretion of contrast material Please see previous notes for details, etiology is not clear but no concern for obstructing malignancy based on previous diagnostic intervention  2. Prostate Cancer ([place]) Slight rise in PSA at 10.5 on 05/18/2020.  PSA will be rechecked in 6 months May consider repeat prostate biopsy in the future if his PSA continues to rise DRE next visit with PSA  3. Benign prostatic hyperplasia without lower urinary tract symptoms Massive BPH but clinically asymptomatic Rectal exam in 6 months  4. Stage 3 chronic kidney disease, unspecified whether stage 3a or 3b CKD Creatinine stable, will continue to follow. Repeat labs next visit  Follow up in 6 months for  PSA and BMP.   Return in about 6 months (around 11/22/2020) for 34mo w/DRE, PSA  &BMP prior.  [place] [address], [place], [place] [address] [phone]  I, Selena Batten, am acting as a scribe for Dr. [name].  I have reviewed the above documentation for accuracy and completeness, and I agree with the above.   [name], MD  I spent 30 total minutes on the day of the encounter including pre-visit review of the medical record, face-to-face time with the patient, and post visit ordering of labs/imaging/tests.

## 2020-05-22 ENCOUNTER — Ambulatory Visit: Payer: 59 | Admitting: Urology

## 2020-05-22 ENCOUNTER — Other Ambulatory Visit: Payer: Self-pay

## 2020-05-22 ENCOUNTER — Encounter: Payer: Self-pay | Admitting: Urology

## 2020-05-22 VITALS — BP 163/75 | HR 45 | Ht 67.0 in | Wt 241.0 lb

## 2020-05-22 DIAGNOSIS — C61 Malignant neoplasm of prostate: Secondary | ICD-10-CM

## 2020-05-22 DIAGNOSIS — N400 Enlarged prostate without lower urinary tract symptoms: Secondary | ICD-10-CM | POA: Diagnosis not present

## 2020-05-22 DIAGNOSIS — N131 Hydronephrosis with ureteral stricture, not elsewhere classified: Secondary | ICD-10-CM | POA: Diagnosis not present

## 2020-05-22 DIAGNOSIS — N183 Chronic kidney disease, stage 3 unspecified: Secondary | ICD-10-CM | POA: Diagnosis not present

## 2020-11-23 ENCOUNTER — Other Ambulatory Visit: Payer: Self-pay

## 2020-11-23 DIAGNOSIS — N200 Calculus of kidney: Secondary | ICD-10-CM

## 2020-11-23 DIAGNOSIS — C61 Malignant neoplasm of prostate: Secondary | ICD-10-CM

## 2020-11-26 ENCOUNTER — Other Ambulatory Visit: Payer: 59

## 2020-11-27 ENCOUNTER — Other Ambulatory Visit: Payer: 59

## 2020-11-27 ENCOUNTER — Other Ambulatory Visit: Payer: Self-pay

## 2020-11-27 DIAGNOSIS — C61 Malignant neoplasm of prostate: Secondary | ICD-10-CM

## 2020-11-27 DIAGNOSIS — N200 Calculus of kidney: Secondary | ICD-10-CM

## 2020-11-28 ENCOUNTER — Encounter: Payer: Self-pay | Admitting: Urology

## 2020-11-28 ENCOUNTER — Ambulatory Visit (INDEPENDENT_AMBULATORY_CARE_PROVIDER_SITE_OTHER): Payer: 59 | Admitting: Urology

## 2020-11-28 VITALS — BP 114/58 | HR 59 | Ht 68.0 in | Wt 240.0 lb

## 2020-11-28 DIAGNOSIS — N183 Chronic kidney disease, stage 3 unspecified: Secondary | ICD-10-CM

## 2020-11-28 DIAGNOSIS — N400 Enlarged prostate without lower urinary tract symptoms: Secondary | ICD-10-CM | POA: Diagnosis not present

## 2020-11-28 DIAGNOSIS — N131 Hydronephrosis with ureteral stricture, not elsewhere classified: Secondary | ICD-10-CM

## 2020-11-28 DIAGNOSIS — C61 Malignant neoplasm of prostate: Secondary | ICD-10-CM | POA: Diagnosis not present

## 2020-11-28 LAB — PSA: Prostate Specific Ag, Serum: 12.4 ng/mL — ABNORMAL HIGH (ref 0.0–4.0)

## 2020-11-28 LAB — BASIC METABOLIC PANEL
BUN/Creatinine Ratio: 14 (ref 10–24)
BUN: 25 mg/dL (ref 8–27)
CO2: 19 mmol/L — ABNORMAL LOW (ref 20–29)
Calcium: 9.4 mg/dL (ref 8.6–10.2)
Chloride: 101 mmol/L (ref 96–106)
Creatinine, Ser: 1.83 mg/dL — ABNORMAL HIGH (ref 0.76–1.27)
GFR calc Af Amer: 41 mL/min/{1.73_m2} — ABNORMAL LOW (ref >59)
GFR calc non Af Amer: 36 mL/min/{1.73_m2} — ABNORMAL LOW (ref >59)
Glucose: 95 mg/dL (ref 65–99)
Potassium: 4.3 mmol/L (ref 3.5–5.2)
Sodium: 138 mmol/L (ref 134–144)

## 2020-11-28 NOTE — Progress Notes (Signed)
11/28/2020 1:14 PM   Javier Watson [birth year] [id number]  Referring provider: Center, [facility] [address] [place],  [name] [address]  Chief Complaint  Patient presents with  . Prostate Cancer    HPI: 75 year old male with complicated urologic history who presents today for routine 32-month follow-up.  He was previously followed at the [facility] now was transferred all of his urologic care to [facility].   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 He recently was treated for a stone near the level of the obstruction successfully. Hydronephrosis on the side is severe and chronic. He has had several Lasix renal scans including 2017 at the [facility] which showed prompt excretion of contrast material without a functional obstruction. Lasix scan was repeated by me on 10/21/2019 which shows his right kidney at 44% function and prompt excretion of contrast material.    He has a personal history of prostate cancer on active surveillance.He has had MRIs at the [place] in the past.   Prostate MRI from 11/2019 shows significant artifact but no obvious evidence of extracapsular extension.This revealed significant prostamegaly, estimated prostatic volume of 217 without evidence of advanced or metastatic disease.   Patient has a personal history of hydrocele status post hydrocelectomy.  Cr 1.83, GFR stable  He continues denies any urinary symptoms. He feels like he has an excellent stream and empties all the way.  Notably, he reports his PSA was just checked at the [place] as well. He reports he was called and told of the numbers actually going down his urologic appointment was canceled.  PSA trend: 12.4 11/27/20 10.5 05/18/20  9.4 03/09/2018 8.17 02/05/2016 3.53 04/30/2011 3.0 09/18/2010 3.154 2010 3.0 3/27/2009TRUS vol 160 g   PMH: Past Medical History:  Diagnosis Date  . Anxiety   . Arthritis   . Cancer ([place])    PROSTATE  . Chronic kidney disease    STAGE 3  . Chronic pain     secondary to trauma  . Depression   . Diabetes mellitus without complication ([place])    patient follows diet only. no medications  . Dysrhythmia    irregular heart beat  . Essential hypertension   . History of kidney stones   . Keratoderma   . Post traumatic stress disorder (PTSD)   . Sleep apnea    C-PAP    Surgical History: Past Surgical History:  Procedure Laterality Date  . Arm Surgery    . BACK SURGERY  1967   Laminectomy on skull of neck  . COLONOSCOPY    . CRANIOTOMY  1967  . CYSTOSCOPY W/ RETROGRADES Right 06/06/2019   Procedure: CYSTOSCOPY WITH RETROGRADE PYELOGRAM;  Surgeon: [name], MD;  Location: [facility] ORS;  Service: Urology;  Laterality: Right;  . CYSTOSCOPY WITH URETEROSCOPY AND STENT PLACEMENT Right 01/2016  . CYSTOSCOPY/URETEROSCOPY/HOLMIUM LASER/STENT PLACEMENT Right 05/09/2019   Procedure: CYSTOSCOPY/URETEROSCOPY/STENT PLACEMENT;  Surgeon: [name], MD;  Location: [facility] OR[facility];  Service: Urology;  Laterality: Right;  . CYSTOSCOPY/URETEROSCOPY/HOLMIUM LASER/STENT PLACEMENT Right 06/06/2019   Procedure: CYSTOSCOPY/URETEROSCOPY/HOLMIUM LASER/STENT Exchange;  Surgeon: [name], MD;  Location: [facility] OR[facility];  Service: Urology;  Laterality: Right;  . HYDROCELE EXCISION Right 10/21/2016   Procedure: HYDROCELECTOMY ADULT;  Surgeon: [name], MD;  Location: [facility] OR[facility];  Service: Urology;  Laterality: Right;  . ORTHOPEDIC SURGERY    . STONE EXTRACTION WITH BASKET  06/06/2019   Procedure: STONE EXTRACTION WITH BASKET;  Surgeon: [name], MD;  Location: [facility] OR[facility];  Service: Urology;;  .  TOTAL HIP ARTHROPLASTY Left 2006  . URETEROSCOPY Right 04/2016   stent removal- spesis    Home Medications:  Allergies as of 11/28/2020   No Known Allergies     Medication List       Accurate as of November 28, 2020  1:14 PM. If you have any questions, ask your nurse or doctor.        STOP taking these medications   HYDROcodone-acetaminophen 5-325 MG  tablet Commonly known as: NORCO/VICODIN   hydrocortisone 25 MG suppository Commonly known as: ANUSOL-HC     TAKE these medications   ammonium lactate 12 % lotion Commonly known as: LAC-HYDRIN   aspirin 81 MG EC tablet Take by mouth. What changed: Another medication with the same name was removed. Continue taking this medication, and follow the directions you see here.   buPROPion 100 MG 12 hr tablet Commonly known as: WELLBUTRIN SR Take 200 mg by mouth 2 (two) times daily.   chlorthalidone 50 MG tablet Commonly known as: HYGROTON Take by mouth.   citalopram 40 MG tablet Commonly known as: CELEXA Take 40 mg by mouth every morning.   diltiazem 360 MG 24 hr capsule Commonly known as: TIAZAC Take 360 mg by mouth every morning.   docusate sodium 100 MG capsule Commonly known as: COLACE Take 1 capsule (100 mg total) by mouth 2 (two) times daily. What changed: when to take this   Fish Oil 1000 MG Caps Take 1,000 mg by mouth 3 (three) times a week.   hydrALAZINE 25 MG tablet Commonly known as: APRESOLINE Take 25 mg by mouth 3 (three) times daily.   losartan 100 MG tablet Commonly known as: COZAAR Take 1 tablet (100 mg total) by mouth at bedtime.   multivitamin with minerals Tabs tablet Take 1 tablet by mouth 3 (three) times a week.   oxyCODONE 5 MG immediate release tablet Commonly known as: Oxy IR/ROXICODONE Take 5 mg by mouth 2 (two) times daily as needed.   potassium chloride SA 20 MEQ tablet Commonly known as: KLOR-CON Take 1 tablet (20 mEq total) by mouth daily. What changed: how much to take   pravastatin 80 MG tablet Commonly known as: PRAVACHOL Take by mouth. What changed: Another medication with the same name was removed. Continue taking this medication, and follow the directions you see here.   prazosin 5 MG capsule Commonly known as: MINIPRESS Take 5 mg by mouth at bedtime.   traZODone 50 MG tablet Commonly known as: DESYREL        Allergies: No Known Allergies  Family History: Family History  Problem Relation Age of Onset  . Hypertension Father   . Bladder Cancer Neg Hx   . Prostate cancer Neg Hx   . Kidney cancer Neg Hx     Social History:  reports that he has never smoked. He has never used smokeless tobacco. He reports that he does not drink alcohol and does not use drugs.   Physical Exam: BP (!) 114/58   Pulse (!) 59   Ht 5\' 8"  (1.727 m)   Wt 240 lb (108.9 kg)   BMI 36.49 kg/m   Constitutional:  Alert and oriented, No acute distress. HEENT: [place] AT, moist mucus membranes.  Trachea midline, no masses. Cardiovascular: No clubbing, cyanosis, or edema. Respiratory: Normal respiratory effort, no increased work of breathing. Rectal: Large external hemorrhoid. Normal sphincter tone. Significant prostatomegaly, able to palpate apex and mid gland without nodules, unable to palpate base due to habitus and gland  size. Skin: No rashes, bruises or suspicious lesions. Neurologic: Grossly intact, no focal deficits, moving all 4 extremities. Psychiatric: Normal mood and affect.  Laboratory Data: Lab Results  Component Value Date   WBC 5.4 04/25/2019   HGB 14.3 05/09/2019   HCT 42.0 05/09/2019   MCV 86.2 04/25/2019   PLT 170 04/25/2019    Lab Results  Component Value Date   CREATININE 1.83 (H) 11/27/2020    Assessment & Plan:    1. Prostate cancer ([place]) Low risk prostate cancer on active surveillance  Recent rising creatinine from 10-12 with overall upward trend in the setting of massive prostatomegaly  Most recent MRI a year ago relatively reassuring and negative for extracapsular extension although there was a lot of artifact from metal  We will plan to repeat his PSA again in 3 months, lab only. If it is trending upwards, consider repeat biopsy. If it stable or trending down, will have him return in 6 months for PSA.   - PSA; Future - PSA; Future  2. Hydronephrosis with ureteral stricture,  not elsewhere classified Ureteral stricture of unknown etiology, chronic without obstruction on Lasix renogram  Conservative management, follow with ultrasound/creatinine intermittently  3. Stage 3 chronic kidney disease, unspecified whether stage 3a or 3b CKD (H[facility]) Creatinine with minimal elevation, essentially stable GFR which is reassuring  4. Benign prostatic hyperplasia without lower urinary tract symptoms Greater than 200 g prostate but otherwise asymptomatic    [name], MD  [facility] [address], [place], [place] [address] [phone]

## 2021-01-07 IMAGING — CT CT RENAL STONE PROTOCOL
2 of 4 series · 16 of 46 positions shown, 18 images · non-contrast
Comparison: None.

CLINICAL DATA: Discolored urine and history of prostate carcinoma

EXAM:
CT ABDOMEN AND PELVIS WITHOUT CONTRAST
TECHNIQUE: Multidetector CT imaging of the abdomen and pelvis was performed
following the standard protocol without IV contrast.

[Series 2: stone full standard (person_name) · axial · 0.80mm/px · z∈[-496,-1]mm · 13 of 109 slices shown, 15 images]
[im 5/109  soft-tissue]
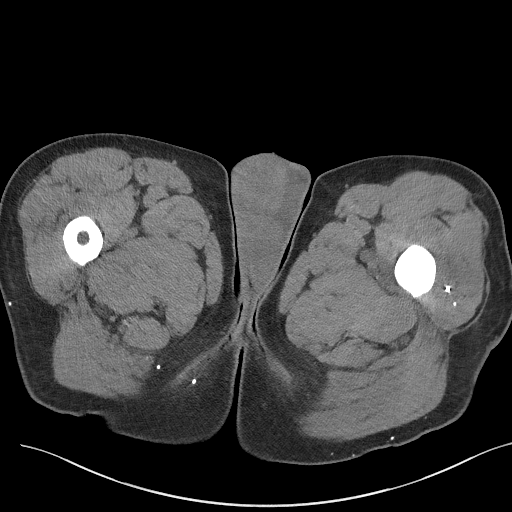
[im 5/109  bone]
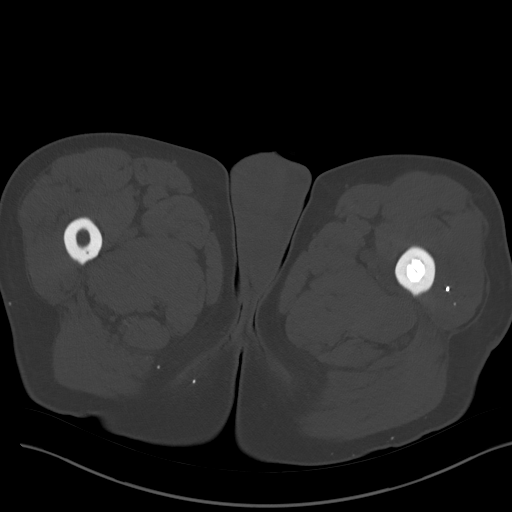
[im 14/109  soft-tissue]
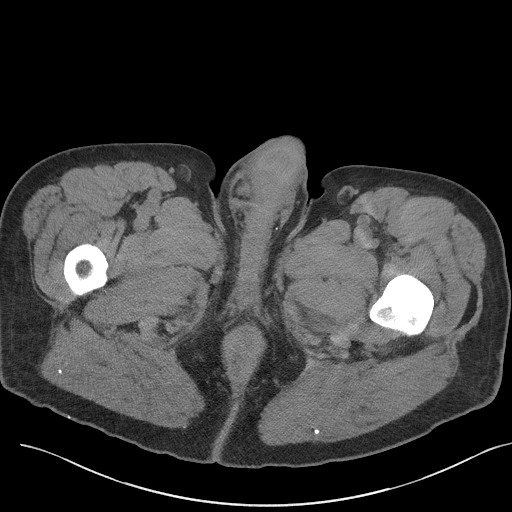
[im 23/109  soft-tissue]
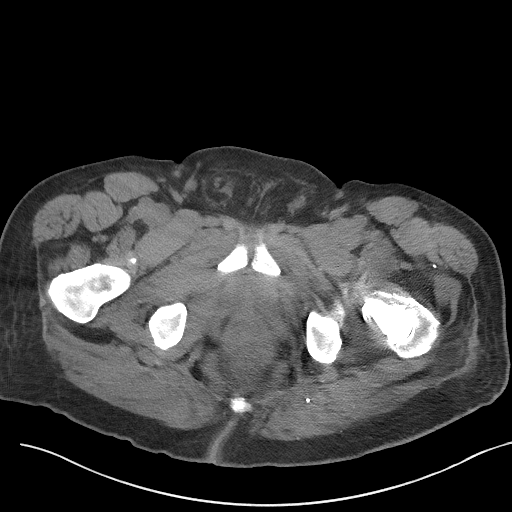
[im 32/109  soft-tissue]
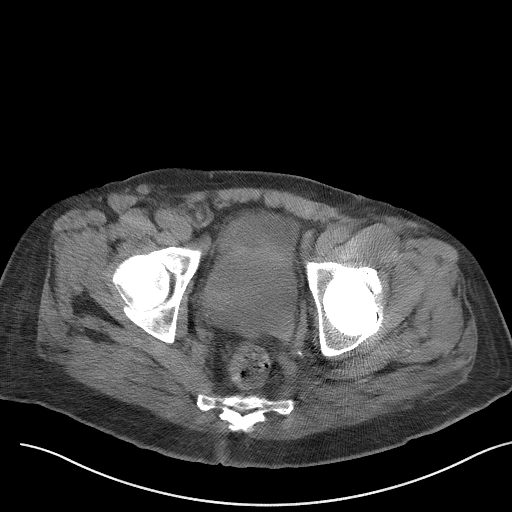
[im 37/109  soft-tissue]
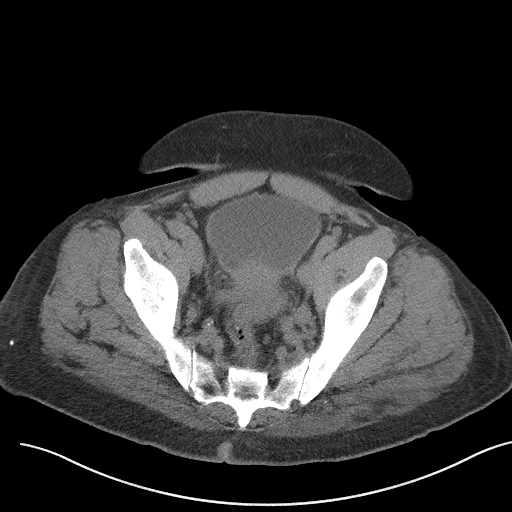
[im 46/109  soft-tissue]
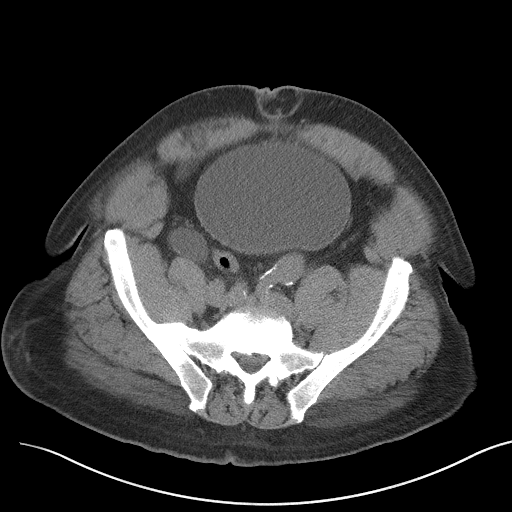
[im 55/109  soft-tissue]
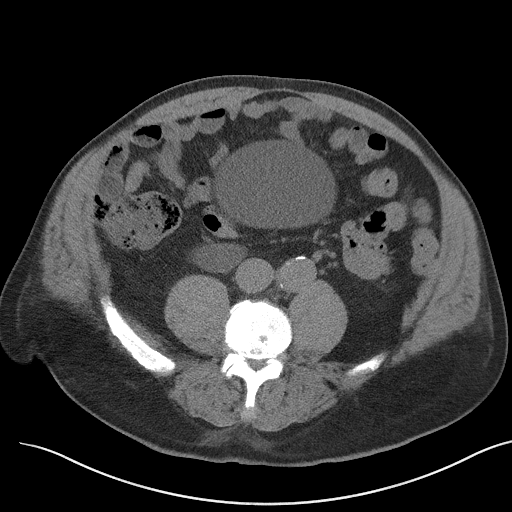
[im 64/109  soft-tissue]
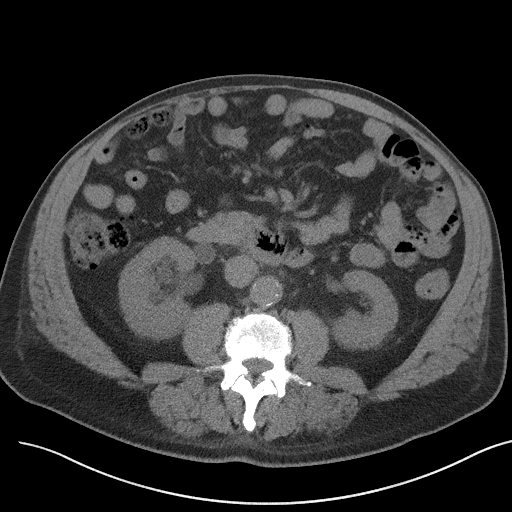
[im 73/109  soft-tissue]
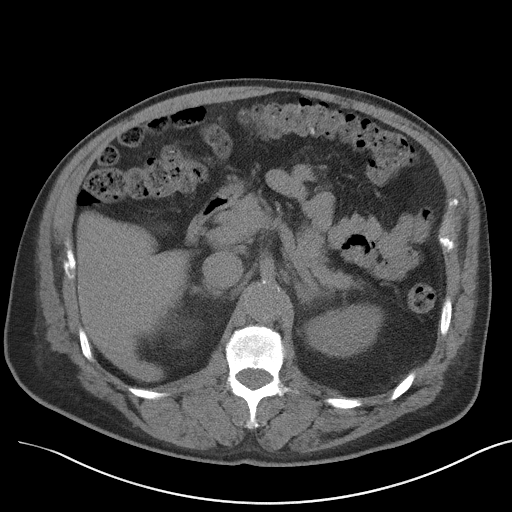
[im 73/109  bone]
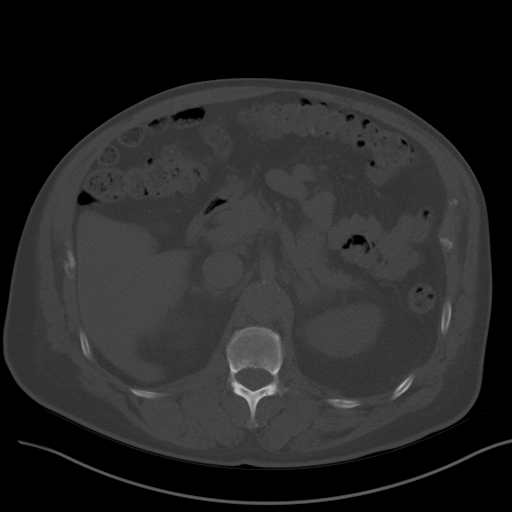
[im 77/109  soft-tissue]
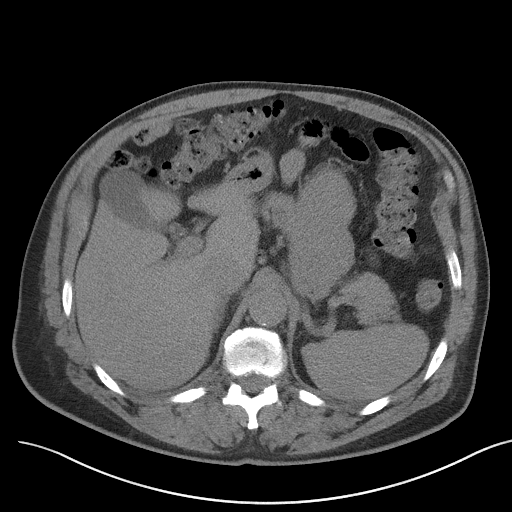
[im 86/109  soft-tissue]
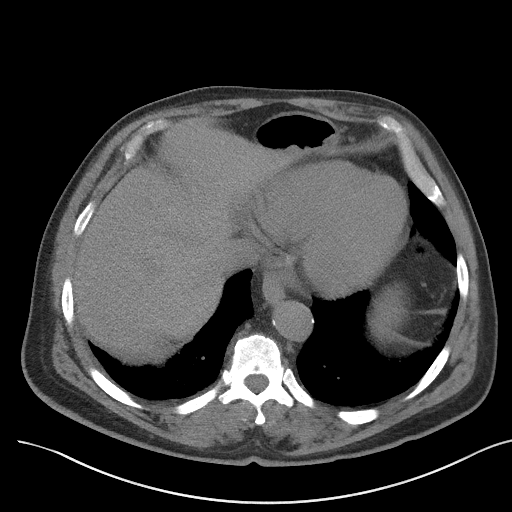
[im 95/109  soft-tissue]
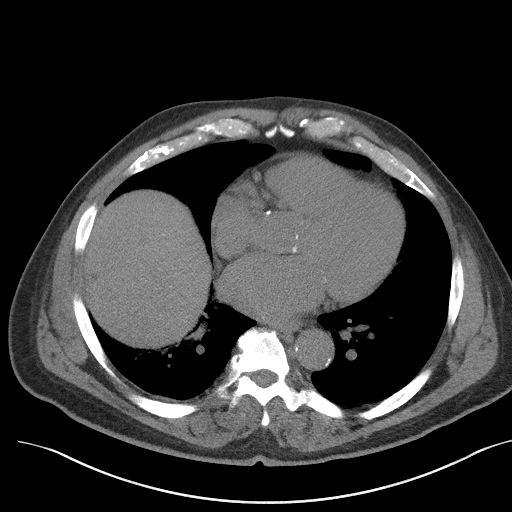
[im 104/109  soft-tissue]
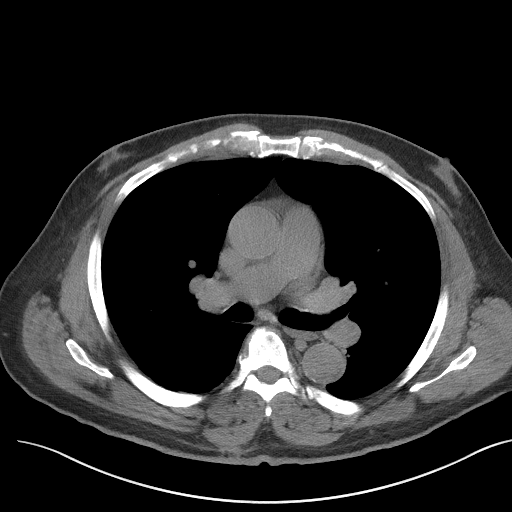

[Series 5: coronal · coronal · 0.88mm/px · 3 of 160 slices shown]
[im 54/160  soft-tissue]
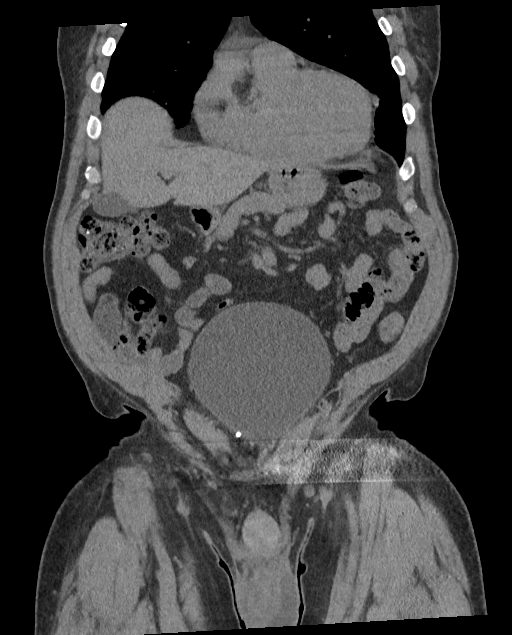
[im 71/160  soft-tissue]
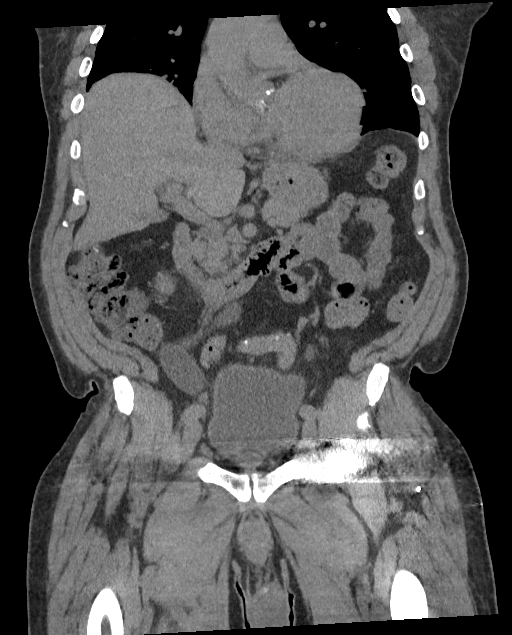
[im 89/160  soft-tissue]
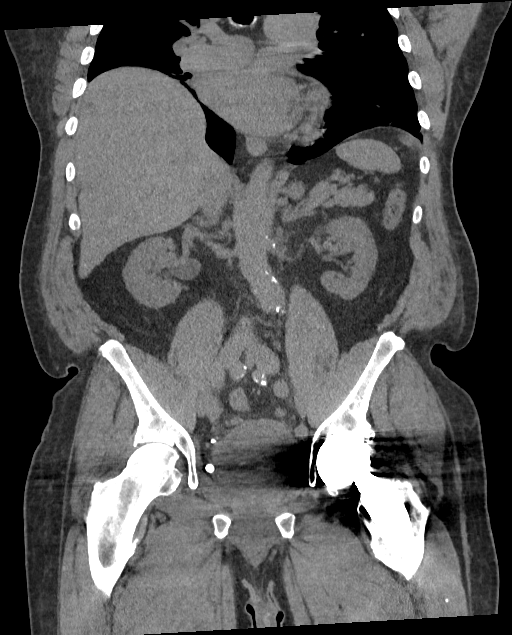

[16 of 46 positions shown; findings below may reference images not displayed]

FINDINGS: Lower chest: Lung bases are free of acute infiltrate or sizable
effusion. A calcified granuloma is noted in the right lower lobe.

Hepatobiliary: No focal liver abnormality is seen. No gallstones,
gallbladder wall thickening, or biliary dilatation.

Pancreas: Unremarkable. No pancreatic ductal dilatation or
surrounding inflammatory changes.

Spleen: Normal in size without focal abnormality.

Adrenals/Urinary Tract: Adrenal glands are within normal limits. The
left kidney demonstrates no renal calculi or obstructive changes.
Scattered small right renal calculi are noted as well as obstructive
change of the collecting system and ureter secondary to an 8 mm
distal right ureteral stone. Bladder is partially distended.

Stomach/Bowel: The appendix is within normal limits. No obstructive
or inflammatory changes of large or small bowel are seen. Small
sliding-type hiatal hernia is noted.

Vascular/Lymphatic: Aortic calcifications are noted with mild
ectasia and tortuosity of the abdominal aorta.

Reproductive: Prostate is enlarged consistent with the given
clinical history.

Other: No abdominal wall hernia or abnormality. No abdominopelvic
ascites.

Musculoskeletal: Left hip prosthesis is noted. Degenerative changes
of lumbar spine are noted.
IMPRESSION: 8 mm distal right ureteral stone causing significant hydronephrosis
and hydroureter.

Changes of prior granulomatous disease.

## 2021-02-26 ENCOUNTER — Other Ambulatory Visit: Payer: 59

## 2021-02-26 ENCOUNTER — Encounter: Payer: Self-pay | Admitting: Urology

## 2021-05-28 ENCOUNTER — Other Ambulatory Visit: Payer: Self-pay

## 2021-05-30 ENCOUNTER — Telehealth: Payer: Self-pay | Admitting: *Deleted

## 2021-05-30 NOTE — Telephone Encounter (Signed)
Left VM to return call-patient No showed for 3 month follow up PSA and No showed for 6 month lab appointment.

## 2021-06-04 ENCOUNTER — Other Ambulatory Visit: Payer: Self-pay

## 2021-06-04 ENCOUNTER — Ambulatory Visit (INDEPENDENT_AMBULATORY_CARE_PROVIDER_SITE_OTHER): Payer: 59 | Admitting: Urology

## 2021-06-04 VITALS — BP 125/73 | HR 52 | Ht 68.0 in | Wt 230.0 lb

## 2021-06-04 DIAGNOSIS — C61 Malignant neoplasm of prostate: Secondary | ICD-10-CM | POA: Diagnosis not present

## 2021-06-04 NOTE — Progress Notes (Signed)
06/04/2021 1:21 PM   Javier Watson [birth year] [id number]  Referring provider: Center, [facility] [address] [place],  [place] [address]  Chief Complaint  Patient presents with   Prostate Cancer    HPI: 75 year old male with complicated urologic history who presents today for routine 71-month follow-up.  He was previously followed at the [facility] now was transferred all of his urologic care to [facility].  Today, he denies any urinary issues whatsoever and no flank pain.  He feels like he is doing well.  He is proud of his erections.  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  He recently was treated for a stone near the level of the obstruction successfully.  Hydronephrosis on the side is severe and chronic.  He has had several Lasix renal scans including 2017 at the [facility] which showed prompt excretion of contrast material without a functional obstruction.  Lasix scan was repeated by me on 10/21/2019 which shows his right kidney at 44% function and prompt excretion of contrast material.    Per review in care everywhere, it also appears that he had a renal bladder ultrasound in June 2022.  This indicated mild right hydronephrosis with 2 nonobstructing stones measuring up to 6 mm.  Left kidney is normal.   He has a personal history of prostate cancer on active surveillance. He has had MRIs at the [place] in the past.    Prostate MRI from 11/2019 shows significant artifact but no obvious evidence of extracapsular extension. This revealed significant prostamegaly, estimated prostatic volume of 217 without evidence of advanced or metastatic disease.  More recently, his PSA has been rising.  It was as high as 12.4, previously in the 8-10 range.  This was 6 months ago.  He is post follow-up with me in April for repeat PSA and again prior to today's appointment but did not do so.  We previously discussed biopsy if his PSA in fact was truly rising.  PSA today unfortunately is pending.  It does  look like he had a PSA at the [place] in February which was 11.66.   Patient has a personal history of hydrocele status post hydrocelectomy.   Cr 1.8, GFR stable.     PSA trend: 12.4 11/27/20 10.5  05/18/20  9.4 03/09/2018 8.17 02/05/2016 3.53 04/30/2011 3.0 09/18/2010 3.154 2010 3.0 3/27/2009TRUS vol 160 g   PMH: Past Medical History:  Diagnosis Date   Anxiety    Arthritis    Cancer ([place])    PROSTATE   Chronic kidney disease    STAGE 3   Chronic pain    secondary to trauma   Depression    Diabetes mellitus without complication ([place])    patient follows diet only. no medications   Dysrhythmia    irregular heart beat   Essential hypertension    History of kidney stones    Keratoderma    Post traumatic stress disorder (PTSD)    Sleep apnea    C-PAP    Surgical History: Past Surgical History:  Procedure Laterality Date   Arm Surgery     BACK SURGERY  1967   Laminectomy on skull of neck   COLONOSCOPY     CRANIOTOMY  1967   CYSTOSCOPY W/ RETROGRADES Right 06/06/2019   Procedure: CYSTOSCOPY WITH RETROGRADE PYELOGRAM;  Surgeon: [name], MD;  Location: [facility] OR[facility];  Service: Urology;  Laterality: Right;   CYSTOSCOPY WITH URETEROSCOPY AND STENT PLACEMENT Right 01/2016   CYSTOSCOPY/URETEROSCOPY/HOLMIUM LASER/STENT PLACEMENT  Right 05/09/2019   Procedure: CYSTOSCOPY/URETEROSCOPY/STENT PLACEMENT;  Surgeon: [name], MD;  Location: [facility];  Service: Urology;  Laterality: Right;   CYSTOSCOPY/URETEROSCOPY/HOLMIUM LASER/STENT PLACEMENT Right 06/06/2019   Procedure: CYSTOSCOPY/URETEROSCOPY/HOLMIUM LASER/STENT Exchange;  Surgeon: [name], MD;  Location: [facility];  Service: Urology;  Laterality: Right;   HYDROCELE EXCISION Right 10/21/2016   Procedure: HYDROCELECTOMY ADULT;  Surgeon: [name], MD;  Location: [facility];  Service: Urology;  Laterality: Right;   ORTHOPEDIC SURGERY     STONE EXTRACTION WITH BASKET  06/06/2019   Procedure: STONE EXTRACTION WITH BASKET;   Surgeon: [name], MD;  Location: [facility] OR[facility];  Service: Urology;;   TOTAL HIP ARTHROPLASTY Left 2006   URETEROSCOPY Right 04/2016   stent removal- spesis    Home Medications:  Allergies as of 06/04/2021       Reactions   Enalapril    Other reaction(s): Cough   Lisinopril    Other reaction(s): Cough        Medication List        Accurate as of June 04, 2021  1:21 PM. If you have any questions, ask your nurse or doctor.          ammonium lactate 12 % lotion Commonly known as: LAC-HYDRIN   aspirin 81 MG EC tablet Take by mouth.   aspirin 81 MG EC tablet Take by mouth.   buPROPion ER 100 MG 12 hr tablet Commonly known as: WELLBUTRIN SR Take 200 mg by mouth 2 (two) times daily.   chlorthalidone 50 MG tablet Commonly known as: HYGROTON Take by mouth.   citalopram 40 MG tablet Commonly known as: CELEXA Take 40 mg by mouth every morning.   diltiazem 360 MG 24 hr capsule Commonly known as: TIAZAC Take 360 mg by mouth every morning.   docusate sodium 100 MG capsule Commonly known as: COLACE Take 1 capsule (100 mg total) by mouth 2 (two) times daily. What changed: when to take this   Fish Oil 1000 MG Caps Take 1,000 mg by mouth 3 (three) times a week.   hydrALAZINE 25 MG tablet Commonly known as: APRESOLINE Take 25 mg by mouth 3 (three) times daily.   hydrocortisone 25 MG suppository Commonly known as: ANUSOL-HC Place rectally.   losartan 100 MG tablet Commonly known as: COZAAR Take 1 tablet (100 mg total) by mouth at bedtime.   losartan 100 MG tablet Commonly known as: COZAAR Take 1 tablet by mouth daily.   multivitamin with minerals Tabs tablet Take 1 tablet by mouth 3 (three) times a week.   oxyCODONE 5 MG immediate release tablet Commonly known as: Oxy IR/ROXICODONE Take 5 mg by mouth 2 (two) times daily as needed.   oxyCODONE-acetaminophen 5-325 MG tablet Commonly known as: PERCOCET/ROXICET Take 1 tablet by mouth 2 (two) times daily  as needed.   potassium chloride SA 20 MEQ tablet Commonly known as: KLOR-CON Take 1 tablet (20 mEq total) by mouth daily. What changed: how much to take   pravastatin 80 MG tablet Commonly known as: PRAVACHOL Take by mouth.   prazosin 5 MG capsule Commonly known as: MINIPRESS Take 5 mg by mouth at bedtime.   Proctosol HC 2.5 % rectal cream Generic drug: hydrocortisone Apply topically.   traZODone 50 MG tablet Commonly known as: DESYREL        Allergies:  Allergies  Allergen Reactions   Enalapril     Other reaction(s): Cough   Lisinopril     Other reaction(s): Cough    Family  History: Family History  Problem Relation Age of Onset   Hypertension Father    Bladder Cancer Neg Hx    Prostate cancer Neg Hx    Kidney cancer Neg Hx     Social History:  reports that he has never smoked. He has never used smokeless tobacco. He reports that he does not drink alcohol and does not use drugs.   Physical Exam: BP 125/73   Pulse (!) 52   Ht 5\' 8"  (1.727 m)   Wt 230 lb (104.3 kg)   BMI 34.97 kg/m   Constitutional:  Alert and oriented, No acute distress. HEENT: [place] AT, moist mucus membranes.  Trachea midline, no masses. Cardiovascular: No clubbing, cyanosis, or edema. Respiratory: Normal respiratory effort, no increased work of breathing. Skin: No rashes, bruises or suspicious lesions. Neurologic: Grossly intact, no focal deficits, moving all 4 extremities. Psychiatric: Normal mood and affect.  Laboratory Data: Most recent labs and renal ultrasound reviewed in care everywhere from the [place]:    1. Prostate cancer ([place]) Low risk prostate cancer on active surveillance  Recent rising PSA 10-12 with overall upward trend in the setting of massive prostatomegaly-PSA today is pending  MRI only modestly helpful due to shrapnel TRUS  If PSA continues to rise, consider repeat biopsy   2. Hydronephrosis with ureteral stricture, not elsewhere  classified Ureteral stricture of unknown etiology, chronic without obstruction on Lasix renogram  Conservative management, follow with ultrasound/creatinine intermittently--> up-to-date performed at [facility]  3. Stage 3 chronic kidney disease, unspecified whether stage 3a or 3b CKD ([place]) Creatinine with minimal elevation, essentially stable GFR which is reassuring  4. Benign prostatic hyperplasia without lower urinary tract symptoms Greater than 200 g prostate but otherwise asymptomatic  Will call with PSA results tomorrow, follow-up in 6 months with PSA/DRE    [name], MD  [facility] [address], [place], [place] [address] [phone]  I spent 30 total minutes on the day of the encounter including pre-visit review of the medical record, face-to-face time with the patient, and post visit ordering of labs/imaging/tests.

## 2021-06-05 ENCOUNTER — Telehealth: Payer: Self-pay | Admitting: *Deleted

## 2021-06-05 LAB — PSA: Prostate Specific Ag, Serum: 10.4 ng/mL — ABNORMAL HIGH (ref 0.0–4.0)

## 2021-06-05 NOTE — Telephone Encounter (Addendum)
Patient informed, voiced understanding.    ----- Message from [name], MD sent at 06/05/2021  8:21 AM EDT ----- PSA is stable from 2 years ago which is reassuring.  Great news.  We will continue to follow you every 6 months.  [name], MD

## 2021-12-05 ENCOUNTER — Other Ambulatory Visit: Payer: 59

## 2021-12-05 ENCOUNTER — Other Ambulatory Visit: Payer: Self-pay | Admitting: *Deleted

## 2021-12-05 DIAGNOSIS — C61 Malignant neoplasm of prostate: Secondary | ICD-10-CM

## 2021-12-10 ENCOUNTER — Ambulatory Visit: Payer: Self-pay | Admitting: Urology

## 2021-12-17 ENCOUNTER — Other Ambulatory Visit: Payer: 59

## 2021-12-17 ENCOUNTER — Other Ambulatory Visit: Payer: Self-pay

## 2021-12-17 DIAGNOSIS — C61 Malignant neoplasm of prostate: Secondary | ICD-10-CM

## 2021-12-18 LAB — PSA: Prostate Specific Ag, Serum: 10.2 ng/mL — ABNORMAL HIGH (ref 0.0–4.0)

## 2021-12-24 NOTE — Progress Notes (Signed)
12/25/21 3:55 PM   Javier Watson 08-04-1946 [id number]  Referring provider:  [facility], [facility] [address] [place],  [place] [address] Chief Complaint  Patient presents with   Prostate Cancer     HPI: Javier Watson is a 76 y.o.male with a personal history of prostate cancer, hydronephrosis with ureteral stricture, stage 3 CKD, and BPH with LUTS, who presents today for 6 month follow-up with PSA.   He was previously followed at the [facility] now was transferred all of his urologic care to [facility].  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  He recently was treated for a stone near the level of the obstruction successfully.  Hydronephrosis on the side is severe and chronic.  He has had several Lasix renal scans including 2017 at the [facility] which showed prompt excretion of contrast material without a functional obstruction.  Lasix scan was repeated by me on 10/21/2019 which shows his right kidney at 44% function and prompt excretion of contrast material.    He had a renal bladder ultrasound in June 2022. This indicated mild right hydronephrosis with 2 nonobstructing stones measuring up to 6 mm.  Left kidney is normal.  He is on active surveillance. He has had MRIs at the [place] in the past.  Prostate MRI from 11/2019 shows significant artifact but no obvious evidence of extracapsular extension. This revealed significant prostamegaly, estimated prostatic volume of 217 without evidence of advanced or metastatic disease.   His most recent PSA on 12/17/2021 was 10.2 with a previous PSA of 10.4 on 06/04/2021.   He reports today that he has decreased sexual function.  For the first time in his life, he is having trouble maintaining and achieving full erections.  This is very bothersome to him as his sexuality is very important.  He denies any urinary symptoms despite significant prostamegaly.  PSA trend  10.2 12/17/21 10.4 06/04/21 12.4 11/27/20 10.5  05/18/20  9.4 03/09/2018 8.17  02/05/2016 3.53 04/30/2011 3.0 09/18/2010 3.154 2010 3.0 3/27/2009TRUS vol 160 g  PMH: Past Medical History:  Diagnosis Date   Anxiety    Arthritis    Cancer ([place])    PROSTATE   Chronic kidney disease    STAGE 3   Chronic pain    secondary to trauma   Depression    Diabetes mellitus without complication ([place])    patient follows diet only. no medications   Dysrhythmia    irregular heart beat   Essential hypertension    History of kidney stones    Keratoderma    Post traumatic stress disorder (PTSD)    Sleep apnea    C-PAP    Surgical History: Past Surgical History:  Procedure Laterality Date   Arm Surgery     BACK SURGERY  1967   Laminectomy on skull of neck   COLONOSCOPY     CRANIOTOMY  1967   CYSTOSCOPY W/ RETROGRADES Right 06/06/2019   Procedure: CYSTOSCOPY WITH RETROGRADE PYELOGRAM;  Surgeon: [name], MD;  Location: [facility];  Service: Urology;  Laterality: Right;   CYSTOSCOPY WITH URETEROSCOPY AND STENT PLACEMENT Right 01/2016   CYSTOSCOPY/URETEROSCOPY/HOLMIUM LASER/STENT PLACEMENT Right 05/09/2019   Procedure: CYSTOSCOPY/URETEROSCOPY/STENT PLACEMENT;  Surgeon: [name], MD;  Location: [facility];  Service: Urology;  Laterality: Right;   CYSTOSCOPY/URETEROSCOPY/HOLMIUM LASER/STENT PLACEMENT Right 06/06/2019   Procedure: CYSTOSCOPY/URETEROSCOPY/HOLMIUM LASER/STENT Exchange;  Surgeon: [name], MD;  Location: [facility];  Service: Urology;  Laterality: Right;   HYDROCELE EXCISION Right 10/21/2016  Procedure: HYDROCELECTOMY ADULT;  Surgeon: [name], MD;  Location: [facility];  Service: Urology;  Laterality: Right;   ORTHOPEDIC SURGERY     STONE EXTRACTION WITH BASKET  06/06/2019   Procedure: STONE EXTRACTION WITH BASKET;  Surgeon: [name], MD;  Location: [facility];  Service: Urology;;   TOTAL HIP ARTHROPLASTY Left 2006   URETEROSCOPY Right 04/2016   stent removal- spesis    Home Medications:  Allergies as of 12/25/2021       Reactions    Enalapril    Other reaction(s): Cough   Lisinopril    Other reaction(s): Cough        Medication List        Accurate as of December 25, 2021  3:55 PM. If you have any questions, ask your nurse or doctor.          STOP taking these medications    oxyCODONE-acetaminophen 5-325 MG tablet Commonly known as: PERCOCET/ROXICET Stopped by: [name], MD       TAKE these medications    ammonium lactate 12 % lotion Commonly known as: LAC-HYDRIN   aspirin 81 MG EC tablet Take by mouth. What changed: Another medication with the same name was removed. Continue taking this medication, and follow the directions you see here. Changed by: [name], MD   buPROPion ER 100 MG 12 hr tablet Commonly known as: WELLBUTRIN SR Take 200 mg by mouth 2 (two) times daily.   chlorthalidone 50 MG tablet Commonly known as: HYGROTON Take by mouth.   citalopram 40 MG tablet Commonly known as: CELEXA Take 40 mg by mouth every morning.   diltiazem 360 MG 24 hr capsule Commonly known as: TIAZAC Take 360 mg by mouth every morning.   docusate sodium 100 MG capsule Commonly known as: COLACE Take 1 capsule (100 mg total) by mouth 2 (two) times daily. What changed: when to take this   Fish Oil 1000 MG Caps Take 1,000 mg by mouth 3 (three) times a week.   hydrALAZINE 25 MG tablet Commonly known as: APRESOLINE Take 25 mg by mouth 3 (three) times daily.   hydrocortisone 25 MG suppository Commonly known as: ANUSOL-HC Place rectally.   losartan 100 MG tablet Commonly known as: COZAAR Take 1 tablet (100 mg total) by mouth at bedtime. What changed: Another medication with the same name was removed. Continue taking this medication, and follow the directions you see here. Changed by: [name], MD   multivitamin with minerals Tabs tablet Take 1 tablet by mouth 3 (three) times a week.   oxyCODONE 5 MG immediate release tablet Commonly known as: Oxy IR/ROXICODONE Take 5 mg  by mouth 2 (two) times daily as needed.   potassium chloride SA 20 MEQ tablet Commonly known as: KLOR-CON M Take 1 tablet (20 mEq total) by mouth daily. What changed: how much to take   pravastatin 80 MG tablet Commonly known as: PRAVACHOL Take by mouth.   prazosin 5 MG capsule Commonly known as: MINIPRESS Take 5 mg by mouth at bedtime.   Proctosol HC 2.5 % rectal cream Generic drug: hydrocortisone Apply topically.   traZODone 50 MG tablet Commonly known as: DESYREL        Allergies:  Allergies  Allergen Reactions   Enalapril     Other reaction(s): Cough   Lisinopril     Other reaction(s): Cough    Family History: Family History  Problem Relation Age of Onset   Hypertension Father    Bladder Cancer Neg Hx  Prostate cancer Neg Hx    Kidney cancer Neg Hx     Social History:  reports that he has never smoked. He has never used smokeless tobacco. He reports that he does not drink alcohol and does not use drugs.   Physical Exam: BP (!) 172/74    Pulse (!) 51    Ht 5\' 8"  (1.727 m)    Wt 230 lb (104.3 kg)    BMI 34.97 kg/m   Constitutional:  Alert and oriented, No acute distress. HEENT: [place] AT, moist mucus membranes.  Trachea midline, no masses. Cardiovascular: No clubbing, cyanosis, or edema. Respiratory: Normal respiratory effort, no increased work of breathing. Rectal: Large external hemorrhoid. Normal sphincter tone. Significant prostatomegaly, able to palpate apex and mid gland without nodules, unable to palpate base due to habitus and gland size. Skin: No rashes, bruises or suspicious lesions. Neurologic: Grossly intact, no focal deficits, moving all 4 extremities. Psychiatric: Normal mood and affect.  Laboratory Data: Lab Results  Component Value Date   CREATININE 1.83 (H) 11/27/2020   Lab Results  Component Value Date   HGBA1C 6.2 (H) 05/15/2016   Assessment & Plan:    1. Prostate cancer ([address]) - Low risk prostate cancer on active  surveillance - PSA stably elevated  - PSA; future   2. Hydronephrosis with ureteral stricture, not elsewhere classified - Ureteral stricture of unknown etiology, chronic without obstruction on Lasix renogram - Conservative management, follow with ultrasound/creatinine intermittently  3. Stage 3 chronic kidney disease, unspecified whether stage 3a or 3b CKD (HCC) - Creatinine with minimal elevation, essentially stable GFR which is reassuring    4. Benign prostatic hyperplasia without lower urinary tract symptoms - Greater than 200 g prostate but otherwise asymptomatic  5. Erectile dysfunction  - He is interested in medication today  - Sildenafil prescribed, 20 mg to 100 mg, titrate dose as needed.  Discussed possible side effects.  All questions were answered.  This was sent to [facility].  Return in 6 months for PSA and in 1 year for follow-up and PSA   I,Kailey Littlejohn,acting as a scribe for [name], MD.,have documented all relevant documentation on the behalf of [name], MD,as directed by  [name], MD while in the presence of [name], MD.  I have reviewed the above documentation for accuracy and completeness, and I agree with the above.   [name], MD  [facility] [address] [place], [place] [address] [phone]

## 2021-12-25 ENCOUNTER — Other Ambulatory Visit: Payer: Self-pay

## 2021-12-25 ENCOUNTER — Ambulatory Visit: Payer: 59 | Admitting: Urology

## 2021-12-25 VITALS — BP 172/74 | HR 51 | Ht 68.0 in | Wt 230.0 lb

## 2021-12-25 DIAGNOSIS — C61 Malignant neoplasm of prostate: Secondary | ICD-10-CM | POA: Diagnosis not present

## 2021-12-25 MED ORDER — SILDENAFIL CITRATE 20 MG PO TABS: ORAL_TABLET | ORAL | 6 refills | Status: DC

## 2022-06-30 ENCOUNTER — Other Ambulatory Visit: Payer: 59

## 2022-06-30 DIAGNOSIS — C61 Malignant neoplasm of prostate: Secondary | ICD-10-CM

## 2022-07-01 LAB — PSA: Prostate Specific Ag, Serum: 10.1 ng/mL — ABNORMAL HIGH (ref 0.0–4.0)

## 2022-07-03 ENCOUNTER — Telehealth: Payer: Self-pay | Admitting: *Deleted

## 2022-07-03 NOTE — Telephone Encounter (Addendum)
Patient informed, voiced understanding.    ----- Message from [name], MD sent at 07/01/2022  8:33 AM EDT ----- PSA stable, great news  [name], MD

## 2022-09-15 ENCOUNTER — Inpatient Hospital Stay: Payer: No Typology Code available for payment source

## 2022-09-15 ENCOUNTER — Inpatient Hospital Stay
Admission: EM | Admit: 2022-09-15 | Discharge: 2022-09-16 | DRG: 309 | Disposition: A | Discharge status: Home / Self Care | Payer: No Typology Code available for payment source | Attending: Internal Medicine | Admitting: Internal Medicine

## 2022-09-15 ENCOUNTER — Encounter: Payer: Self-pay | Admitting: Intensive Care

## 2022-09-15 ENCOUNTER — Emergency Department: Payer: No Typology Code available for payment source

## 2022-09-15 ENCOUNTER — Other Ambulatory Visit: Payer: Self-pay

## 2022-09-15 DIAGNOSIS — Z96642 Presence of left artificial hip joint: Secondary | ICD-10-CM | POA: Diagnosis present

## 2022-09-15 DIAGNOSIS — Z87442 Personal history of urinary calculi: Secondary | ICD-10-CM

## 2022-09-15 DIAGNOSIS — F515 Nightmare disorder: Secondary | ICD-10-CM | POA: Diagnosis present

## 2022-09-15 DIAGNOSIS — E876 Hypokalemia: Secondary | ICD-10-CM | POA: Diagnosis present

## 2022-09-15 DIAGNOSIS — E785 Hyperlipidemia, unspecified: Secondary | ICD-10-CM | POA: Diagnosis present

## 2022-09-15 DIAGNOSIS — I10 Essential (primary) hypertension: Secondary | ICD-10-CM | POA: Diagnosis present

## 2022-09-15 DIAGNOSIS — F32A Depression, unspecified: Secondary | ICD-10-CM | POA: Diagnosis present

## 2022-09-15 DIAGNOSIS — R0602 Shortness of breath: Secondary | ICD-10-CM | POA: Diagnosis present

## 2022-09-15 DIAGNOSIS — E1122 Type 2 diabetes mellitus with diabetic chronic kidney disease: Secondary | ICD-10-CM | POA: Diagnosis present

## 2022-09-15 DIAGNOSIS — R001 Bradycardia, unspecified: Secondary | ICD-10-CM | POA: Diagnosis present

## 2022-09-15 DIAGNOSIS — N189 Chronic kidney disease, unspecified: Secondary | ICD-10-CM | POA: Diagnosis not present

## 2022-09-15 DIAGNOSIS — K219 Gastro-esophageal reflux disease without esophagitis: Secondary | ICD-10-CM | POA: Diagnosis present

## 2022-09-15 DIAGNOSIS — I129 Hypertensive chronic kidney disease with stage 1 through stage 4 chronic kidney disease, or unspecified chronic kidney disease: Secondary | ICD-10-CM | POA: Diagnosis present

## 2022-09-15 DIAGNOSIS — E1165 Type 2 diabetes mellitus with hyperglycemia: Secondary | ICD-10-CM | POA: Diagnosis present

## 2022-09-15 DIAGNOSIS — Z888 Allergy status to other drugs, medicaments and biological substances status: Secondary | ICD-10-CM

## 2022-09-15 DIAGNOSIS — N1832 Chronic kidney disease, stage 3b: Secondary | ICD-10-CM | POA: Diagnosis present

## 2022-09-15 DIAGNOSIS — I442 Atrioventricular block, complete: Secondary | ICD-10-CM | POA: Diagnosis not present

## 2022-09-15 DIAGNOSIS — Z8546 Personal history of malignant neoplasm of prostate: Secondary | ICD-10-CM | POA: Diagnosis not present

## 2022-09-15 DIAGNOSIS — G4733 Obstructive sleep apnea (adult) (pediatric): Secondary | ICD-10-CM | POA: Diagnosis present

## 2022-09-15 DIAGNOSIS — G894 Chronic pain syndrome: Secondary | ICD-10-CM | POA: Diagnosis present

## 2022-09-15 DIAGNOSIS — N400 Enlarged prostate without lower urinary tract symptoms: Secondary | ICD-10-CM | POA: Diagnosis present

## 2022-09-15 DIAGNOSIS — Z8249 Family history of ischemic heart disease and other diseases of the circulatory system: Secondary | ICD-10-CM

## 2022-09-15 DIAGNOSIS — F4310 Post-traumatic stress disorder, unspecified: Secondary | ICD-10-CM | POA: Diagnosis present

## 2022-09-15 DIAGNOSIS — Z6834 Body mass index (BMI) 34.0-34.9, adult: Secondary | ICD-10-CM | POA: Diagnosis not present

## 2022-09-15 DIAGNOSIS — Z79899 Other long term (current) drug therapy: Secondary | ICD-10-CM | POA: Diagnosis not present

## 2022-09-15 DIAGNOSIS — R739 Hyperglycemia, unspecified: Secondary | ICD-10-CM | POA: Diagnosis present

## 2022-09-15 DIAGNOSIS — N179 Acute kidney failure, unspecified: Secondary | ICD-10-CM | POA: Diagnosis present

## 2022-09-15 DIAGNOSIS — E669 Obesity, unspecified: Secondary | ICD-10-CM | POA: Diagnosis present

## 2022-09-15 LAB — COMPREHENSIVE METABOLIC PANEL
ALT: 25 U/L (ref 0–44)
AST: 23 U/L (ref 15–41)
Albumin: 3.7 g/dL (ref 3.5–5.0)
Alkaline Phosphatase: 66 U/L (ref 38–126)
Anion gap: 5 (ref 5–15)
BUN: 34 mg/dL — ABNORMAL HIGH (ref 8–23)
CO2: 27 mmol/L (ref 22–32)
Calcium: 8.9 mg/dL (ref 8.9–10.3)
Chloride: 106 mmol/L (ref 98–111)
Creatinine, Ser: 1.79 mg/dL — ABNORMAL HIGH (ref 0.61–1.24)
GFR, Estimated: 39 mL/min — ABNORMAL LOW (ref >60)
Glucose, Bld: 114 mg/dL — ABNORMAL HIGH (ref 70–99)
Potassium: 4.5 mmol/L (ref 3.5–5.1)
Sodium: 138 mmol/L (ref 135–145)
Total Bilirubin: 0.8 mg/dL (ref 0.3–1.2)
Total Protein: 6.7 g/dL (ref 6.5–8.1)

## 2022-09-15 LAB — CBC WITH DIFFERENTIAL/PLATELET
Abs Immature Granulocytes: 0.01 10*3/uL (ref 0.00–0.07)
Basophils Absolute: 0 10*3/uL (ref 0.0–0.1)
Basophils Relative: 1 %
Eosinophils Absolute: 0 10*3/uL (ref 0.0–0.5)
Eosinophils Relative: 0 %
HCT: 42.6 % (ref 39.0–52.0)
Hemoglobin: 14.2 g/dL (ref 13.0–17.0)
Immature Granulocytes: 0 %
Lymphocytes Relative: 20 %
Lymphs Abs: 1.2 10*3/uL (ref 0.7–4.0)
MCH: 29.6 pg (ref 26.0–34.0)
MCHC: 33.3 g/dL (ref 30.0–36.0)
MCV: 88.9 fL (ref 80.0–100.0)
Monocytes Absolute: 0.7 10*3/uL (ref 0.1–1.0)
Monocytes Relative: 12 %
Neutro Abs: 3.9 10*3/uL (ref 1.7–7.7)
Neutrophils Relative %: 67 %
Platelets: 148 10*3/uL — ABNORMAL LOW (ref 150–400)
RBC: 4.79 MIL/uL (ref 4.22–5.81)
RDW: 16 % — ABNORMAL HIGH (ref 11.5–15.5)
WBC: 5.9 10*3/uL (ref 4.0–10.5)
nRBC: 0 % (ref 0.0–0.2)

## 2022-09-15 LAB — TROPONIN I (HIGH SENSITIVITY)
Troponin I (High Sensitivity): 9 ng/L (ref <18)
Troponin I (High Sensitivity): 9 ng/L (ref <18)

## 2022-09-15 LAB — MAGNESIUM: Magnesium: 2.1 mg/dL (ref 1.7–2.4)

## 2022-09-15 MED ORDER — ONDANSETRON HCL 4 MG/2ML IJ SOLN: 4.0000 mg | Freq: Four times a day (QID) | INTRAMUSCULAR | Status: DC | PRN

## 2022-09-15 MED ORDER — PRAVASTATIN SODIUM 40 MG PO TABS
80.0000 mg | ORAL_TABLET | Freq: Every day | ORAL | Status: DC
  Filled 2022-09-15: qty 2

## 2022-09-15 MED ORDER — HYDRALAZINE HCL 50 MG PO TABS
25.0000 mg | ORAL_TABLET | Freq: Three times a day (TID) | ORAL | Status: DC
  Administered 2022-09-15 – 2022-09-16 (×2): 25 mg via ORAL
  Filled 2022-09-15 (×2): qty 1

## 2022-09-15 MED ORDER — HEPARIN SODIUM (PORCINE) 5000 UNIT/ML IJ SOLN
5000.0000 [IU] | Freq: Three times a day (TID) | INTRAMUSCULAR | Status: AC
  Administered 2022-09-15: 5000 [IU] via SUBCUTANEOUS
  Filled 2022-09-15: qty 1

## 2022-09-15 MED ORDER — ACETAMINOPHEN 325 MG PO TABS: 650.0000 mg | ORAL_TABLET | Freq: Four times a day (QID) | ORAL | Status: DC | PRN

## 2022-09-15 MED ORDER — LOSARTAN POTASSIUM 50 MG PO TABS
100.0000 mg | ORAL_TABLET | Freq: Every day | ORAL | Status: DC
  Administered 2022-09-16: 100 mg via ORAL
  Filled 2022-09-15 (×2): qty 2

## 2022-09-15 MED ORDER — PANTOPRAZOLE SODIUM 40 MG PO TBEC: 40.0000 mg | DELAYED_RELEASE_TABLET | Freq: Every day | ORAL | Status: DC | PRN

## 2022-09-15 MED ORDER — DICLOFENAC SODIUM 1 % EX GEL: 2.0000 g | Freq: Four times a day (QID) | CUTANEOUS | Status: DC | PRN

## 2022-09-15 MED ORDER — METHOCARBAMOL 500 MG PO TABS
500.0000 mg | ORAL_TABLET | Freq: Every day | ORAL | Status: DC
  Filled 2022-09-15: qty 1

## 2022-09-15 MED ORDER — BUPROPION HCL ER (SR) 100 MG PO TB12
200.0000 mg | ORAL_TABLET | Freq: Two times a day (BID) | ORAL | Status: DC
  Administered 2022-09-16: 200 mg via ORAL
  Filled 2022-09-15 (×2): qty 2

## 2022-09-15 MED ORDER — ACETAMINOPHEN 325 MG RE SUPP: 650.0000 mg | Freq: Four times a day (QID) | RECTAL | Status: DC | PRN

## 2022-09-15 MED ORDER — CHLORTHALIDONE 25 MG PO TABS
50.0000 mg | ORAL_TABLET | Freq: Every day | ORAL | Status: DC
  Administered 2022-09-16: 50 mg via ORAL
  Filled 2022-09-15: qty 2

## 2022-09-15 MED ORDER — TRAZODONE HCL 50 MG PO TABS
50.0000 mg | ORAL_TABLET | Freq: Every day | ORAL | Status: DC
  Administered 2022-09-15: 50 mg via ORAL
  Filled 2022-09-15: qty 1

## 2022-09-15 MED ORDER — ONDANSETRON HCL 4 MG PO TABS: 4.0000 mg | ORAL_TABLET | Freq: Four times a day (QID) | ORAL | Status: DC | PRN

## 2022-09-15 MED ORDER — CITALOPRAM HYDROBROMIDE 20 MG PO TABS
40.0000 mg | ORAL_TABLET | ORAL | Status: DC
  Administered 2022-09-16: 40 mg via ORAL
  Filled 2022-09-15: qty 2

## 2022-09-15 MED ORDER — SENNOSIDES-DOCUSATE SODIUM 8.6-50 MG PO TABS: 1.0000 | ORAL_TABLET | Freq: Every evening | ORAL | Status: DC | PRN

## 2022-09-15 MED ORDER — PRAZOSIN HCL 5 MG PO CAPS
5.0000 mg | ORAL_CAPSULE | Freq: Every day | ORAL | Status: DC
  Administered 2022-09-15: 5 mg via ORAL
  Filled 2022-09-15 (×2): qty 1

## 2022-09-15 NOTE — ED Triage Notes (Signed)
Patient c/o slight headache and for the past three days has had 42HR at home. [facility] told [facility] to come get checked out at ER.   Ambulatory to triage room with NAD noted.

## 2022-09-15 NOTE — Hospital Course (Addendum)
Mr. Javier Watson is a 76 year old male with history of hypertension, hyperlipidemia, CKD 3B, OSA, depression, anxiety, chronic pain syndrome, who presents to the emergency department from home for chief concerns of lightheadedness and new bradycardia.  [facility] come to [facility] for further evaluation.  Initial vitals in the emergency department showed temperature of 98.6, respiration rate of 20, heart rate of 62 with the lowest being 40, blood pressure 141/76, SPO2 of 95% on room air.  Serum sodium is 138, potassium 4.5, chloride 106, bicarb 27, BUN of 34, serum creatinine of 1.79, GFR 39, nonfasting blood glucose 114, WBC 5.9, hemoglobin 14.2, platelets of 148.  High sensitive troponin is 9.  EDP called cardiology, Dr. Clayborn Bigness who has asked that the hospitalist call him if a cardiology consultation is desired.  On 09/16/2022, patient was feeling much better.  His Cardizem was held.  On telemetry monitoring EKG did have first-degree AV block and was in sinus rhythm.  Cardiology cleared to go home and holding all rate controlling medications.  Patient improved much quicker than expected.

## 2022-09-15 NOTE — Assessment & Plan Note (Signed)
-   Resumed home losartan 100 mg daily, hydralazine 25 mg 3 times daily, chlorthalidone 50 mg daily

## 2022-09-15 NOTE — Assessment & Plan Note (Signed)
-   Holding home diltiazem 360 mg daily, this has been held on admission -On telemetry monitoring and EKG first-degree AV block seen.  Complete heart block has resolved holding rate controlling medications.  Cardiology cleared for discharge home.  Would not prescribe any rate controlling medications at this point.

## 2022-09-15 NOTE — Consult Note (Signed)
CARDIOLOGY CONSULT NOTE               Patient ID: Javier Watson MRN: [id number] DOB/AGE: 12-12-1945 76 y.o.  Admit date: 09/15/2022 Referring Physician Dr. Londell Moh hospitalist Primary Physician [facility] Primary Cardiologist  Reason for Consultation bradycardia complete heart block  HPI: Patient 76 year old obese black male usually goes to the [place] hypertension hyperlipidemia chronic renal sufficiency obstructive sleep apnea depression anxiety presented with severe bradycardia which she has had for some time but is gotten progressively worse his heart rate is usually low in the 50s but then he got into the low 40s he called his primary at the [place] who then told him to come to the emergency room he been having reduction in his Cardizem which he uses for blood pressure but now his heart rate was around 40 so this of concern about complete heart block and possible need for permanent pacemaker patient denies any chest pain has had some mild vertigo lightheadedness  Review of systems complete and found to be negative unless listed above     Past Medical History:  Diagnosis Date   Anxiety    Arthritis    Cancer (HCC)    PROSTATE   Chronic kidney disease    STAGE 3   Chronic pain    secondary to trauma   Depression    Diabetes mellitus without complication ([facility])    patient follows diet only. no medications   Dysrhythmia    irregular heart beat   Essential hypertension    History of kidney stones    Keratoderma    Post traumatic stress disorder (PTSD)    Sleep apnea    C-PAP    Past Surgical History:  Procedure Laterality Date   Arm Surgery     BACK SURGERY  1967   Laminectomy on skull of neck   COLONOSCOPY     CRANIOTOMY  1967   CYSTOSCOPY W/ RETROGRADES Right 06/06/2019   Procedure: CYSTOSCOPY WITH RETROGRADE PYELOGRAM;  Surgeon: Vanna Scotland, MD;  Location: [facility];  Service: Urology;  Laterality: Right;   CYSTOSCOPY WITH URETEROSCOPY AND STENT PLACEMENT  Right 01/2016   CYSTOSCOPY/URETEROSCOPY/HOLMIUM LASER/STENT PLACEMENT Right 05/09/2019   Procedure: CYSTOSCOPY/URETEROSCOPY/STENT PLACEMENT;  Surgeon: Vanna Scotland, MD;  Location: [facility];  Service: Urology;  Laterality: Right;   CYSTOSCOPY/URETEROSCOPY/HOLMIUM LASER/STENT PLACEMENT Right 06/06/2019   Procedure: CYSTOSCOPY/URETEROSCOPY/HOLMIUM LASER/STENT Exchange;  Surgeon: Vanna Scotland, MD;  Location: [facility];  Service: Urology;  Laterality: Right;   HYDROCELE EXCISION Right 10/21/2016   Procedure: HYDROCELECTOMY ADULT;  Surgeon: Vanna Scotland, MD;  Location: [facility];  Service: Urology;  Laterality: Right;   ORTHOPEDIC SURGERY     STONE EXTRACTION WITH BASKET  06/06/2019   Procedure: STONE EXTRACTION WITH BASKET;  Surgeon: Vanna Scotland, MD;  Location: [facility];  Service: Urology;;   TOTAL HIP ARTHROPLASTY Left 2006   URETEROSCOPY Right 04/2016   stent removal- spesis    (Not in a hospital admission)  Social History   Socioeconomic History   Marital status: Married    Spouse name: Not on file   Number of children: Not on file   Years of education: Not on file   Highest education level: Not on file  Occupational History   Not on file  Tobacco Use   Smoking status: Never   Smokeless tobacco: Never  Vaping Use   Vaping Use: Never used  Substance and Sexual Activity   Alcohol use: No   Drug use: Yes  Comment: prescribed oxycodone   Sexual activity: Not on file  Other Topics Concern   Not on file  Social History Narrative   Not on file   Social Determinants of Health   Financial Resource Strain: Not on file  Food Insecurity: Not on file  Transportation Needs: Not on file  Physical Activity: Not on file  Stress: Not on file  Social Connections: Not on file  Intimate Partner Violence: Not on file    Family History  Problem Relation Age of Onset   Hypertension Father    Bladder Cancer Neg Hx    Prostate cancer Neg Hx    Kidney cancer Neg Hx       Review  of systems complete and found to be negative unless listed above      PHYSICAL EXAM  General: Well developed, well nourished, in no acute distress HEENT:  Normocephalic and atramatic Neck:  No JVD.  Lungs: Clear bilaterally to auscultation and percussion. Heart: Bradycardia. Normal S1 and S2 without gallops or murmurs.  Abdomen: Bowel sounds are positive, abdomen soft and non-tender  Msk:  Back normal, normal gait. Normal strength and tone for age. Extremities: No clubbing, cyanosis or edema.   Neuro: Alert and oriented X 3. Psych:  Good affect, responds appropriately  Labs:   Lab Results  Component Value Date   WBC 5.9 09/15/2022   HGB 14.2 09/15/2022   HCT 42.6 09/15/2022   MCV 88.9 09/15/2022   PLT 148 (L) 09/15/2022    Recent Labs  Lab 09/15/22 1436  NA 138  K 4.5  CL 106  CO2 27  BUN 34*  CREATININE 1.79*  CALCIUM 8.9  PROT 6.7  BILITOT 0.8  ALKPHOS 66  ALT 25  AST 23  GLUCOSE 114*   Lab Results  Component Value Date   TROPONINI 0.31 (HH) 05/15/2016   No results found for: "CHOL" No results found for: "HDL" No results found for: "LDLCALC" No results found for: "TRIG" No results found for: "CHOLHDL" No results found for: "LDLDIRECT"    Radiology: [facility] Chest Port 1 View  Result Date: 09/15/2022 CLINICAL DATA:  Bradycardia.  Headache. EXAM: PORTABLE CHEST 1 VIEW COMPARISON:  02/27/2008 FINDINGS: Numerous leads and wires project over the chest. Apical lordotic positioning. External pacer/defibrillator projects over the left hemithorax. Midline trachea. Cardiomegaly accentuated by AP portable technique. Atherosclerosis in the transverse aorta. Moderate right hemidiaphragm elevation. No pleural effusion or pneumothorax. No congestive failure. Given above technique limitations, no pulmonary consolidation identified. IMPRESSION: Cardiomegaly without congestive failure. Aortic Atherosclerosis (ICD10-I70.0). Electronically Signed   By: Jeronimo Greaves M.D.   On:  09/15/2022 15:34    EKG: Bradycardia possible complete heart block rate of 40  ASSESSMENT AND PLAN:  Complete heart block/bradycardia Hypertension GERD Obstructive sleep apnea Shortness of breath Chronic renal sufficiency stage III Obesity Nightmare disorder/PTSD . Plan Agree with admit to telemetry follow-up EKGs troponins Discontinue Cardizem because of significant bradycardia Indication for temporary or permanent pacemaker Department oxygen as necessary for shortness of breath Sleep study CPAP for obstructive sleep apnea Renal sufficiency stage III consider nephrology input avoid nephrotoxic drugs  Signed: Alwyn Pea MD 09/15/2022, 5:00 PM

## 2022-09-15 NOTE — ED Provider Triage Note (Signed)
Emergency Medicine Provider Triage Evaluation Note  Javier Watson , a 76 y.o. male  was evaluated in triage.  Pt complains of bradycardia. Rate has been in the low 40s. Recent BP medication change. No shortness of breath, chest pain, or palpitations.  Physical Exam  BP (!) 141/76 (BP Location: Left Arm)   Pulse (!) 43   Temp 98.6 F (37 C) (Oral)   Resp 16   SpO2 96%  Gen:   Awake, no distress   Resp:  Normal effort  MSK:   Moves extremities without difficulty  Other:    Medical Decision Making  Medically screening exam initiated at 2:24 PM.  Appropriate orders placed.  Javier Watson was informed that the remainder of the evaluation will be completed by another provider, this initial triage assessment does not replace that evaluation, and the importance of remaining in the ED until their evaluation is complete.   Victorino Dike, FNP 09/15/22 1425

## 2022-09-15 NOTE — Assessment & Plan Note (Addendum)
-   CPAP nightly ordered 

## 2022-09-15 NOTE — Assessment & Plan Note (Signed)
At baseline 

## 2022-09-15 NOTE — Assessment & Plan Note (Signed)
-   Patient is not on diabetic medication as he states he was borderline

## 2022-09-15 NOTE — Assessment & Plan Note (Addendum)
-   With history of PTSD - Resumed home prazosin 5 mg nightly on admission, though this is an alpha-blocker can consider discontinuing if patient's bradycardia does not improve

## 2022-09-15 NOTE — ED Provider Notes (Addendum)
[facility] Provider Note    Event Date/Time   First MD Initiated Contact with Patient 09/15/22 1457     (approximate)   History   Bradycardia   HPI  Javier Watson is a 76 y.o. male with past medical history significant for prostate cancer, CKD, hypertension, PTSD, OSA, presents to the emergency department for bradycardia.  Patient states that on Saturday he started to have some lightheadedness, he checked his blood pressure at home and it said that his heart rate was low in the 40s.  Chest multiple times over the past couple of days and it continues to be low.  Called the [place] and they told him to come to the emergency department.  Denies any chest pain or shortness of breath.  States that recently he was told to change his diltiazem dose but he is uncertain if he was told to decrease it or increase it.  He believes that he had too low of a heart rate so they wanted to change it to increase his heart rate.  States that he was taking 360 mg of diltiazem and is unable to state how much she has been taking recently.  Denies history of atrial fibrillation.  Not on anticoagulation.  Denies any chest pain, denies nausea vomiting or diarrhea.  No other changes of recent medications.  Does not have a cardiologist.     Physical Exam   Triage Vital Signs: ED Triage Vitals  Enc Vitals Group     BP 09/15/22 1424 (!) 141/76     Pulse Rate 09/15/22 1424 (!) 43     Resp 09/15/22 1424 16     Temp 09/15/22 1424 98.6 F (37 C)     Temp Source 09/15/22 1424 Oral     SpO2 09/15/22 1424 96 %     Weight 09/15/22 1426 230 lb (104.3 kg)     Height 09/15/22 1426 '5\' 8"'$  (1.727 m)     Head Circumference --      Peak Flow --      Pain Score 09/15/22 1424 6     Pain Loc --      Pain Edu? --      Excl. in [place]? --     Most recent vital signs: Vitals:   09/15/22 1545 09/15/22 1600  BP:  (!) 142/70  Pulse: (!) 41 (!) 41  Resp: 18 (!) 23  Temp:    SpO2: 98% 97%    Physical  Exam Constitutional:      Appearance: He is well-developed.  HENT:     Head: Atraumatic.  Eyes:     Conjunctiva/sclera: Conjunctivae normal.  Cardiovascular:     Rate and Rhythm: Bradycardia present.     Heart sounds: No murmur heard. Pulmonary:     Effort: No respiratory distress.  Musculoskeletal:     Cervical back: Normal range of motion.  Skin:    General: Skin is warm.  Neurological:     Mental Status: He is alert. Mental status is at baseline.          IMPRESSION / MDM / ASSESSMENT AND PLAN / ED COURSE  I reviewed the triage vital signs and the nursing notes.  Differential diagnosis including complete heart block, dysrhythmia, dehydration, electrolyte abnormality.  Cardiac pacing pads were placed on the patient.  Patient with significant bradycardia in the emergency department but does remain hemodynamically stable with good perfusion.  EKG  EKG with complete heart block.  Heart rate 44.  QTc 450.  No significant ST elevation or depression.  No signs of acute ischemia.  Complete heart block while on cardiac telemetry in the emergency department.  RADIOLOGY I independently reviewed imaging, my interpretation of imaging: Cardiomegaly.  No pulmonary edema.  No widened mediastinum.      ED Results / Procedures / Treatments   Labs (all labs ordered are listed, but only abnormal results are displayed) Labs interpreted as -   No significant electrolyte abnormalities.  Labs Reviewed  CBC WITH DIFFERENTIAL/PLATELET - Abnormal; Notable for the following components:      Result Value   RDW 16.0 (*)    Platelets 148 (*)    All other components within normal limits  COMPREHENSIVE METABOLIC PANEL - Abnormal; Notable for the following components:   Glucose, Bld 114 (*)    BUN 34 (*)    Creatinine, Ser 1.79 (*)    GFR, Estimated 39 (*)    All other components within normal limits  MAGNESIUM  TROPONIN I (HIGH SENSITIVITY)  TROPONIN I (HIGH SENSITIVITY)     Consulted cardiology and discussed the patient's case with Dr. Clayborn Bigness who recommended admission to the hospitalist.  Stated that he would follow once reconsulted from the hospitalist.  No further interventions at this time.  Consult to the hospitalist discussed with Dr. Tobie Poet.  Confirmed with the patient that he would want inpatient hospitalization at [facility] and would not want transfer to the [place].   PROCEDURES:  Critical Care performed: Yes  Procedures  Total critical care time: 45 minutes  Critical care time was exclusive of separately billable procedures and treating other patients.  Critical care was necessary to treat or prevent imminent or life-threatening deterioration.  Critical care was time spent personally by me on the following activities: development of treatment plan with patient and/or surrogate as well as nursing, discussions with consultants, evaluation of patient's response to treatment, examination of patient, obtaining history from patient or surrogate, ordering and performing treatments and interventions, ordering and review of laboratory studies, ordering and review of radiographic studies, pulse oximetry and re-evaluation of patient's condition.   Patient's presentation is most consistent with acute presentation with potential threat to life or bodily function.   MEDICATIONS ORDERED IN ED: Medications - No data to display  FINAL CLINICAL IMPRESSION(S) / ED DIAGNOSES   Final diagnoses:  Complete heart block ([place])     Rx / DC Orders   ED Discharge Orders     None        Note:  This document was prepared using Dragon voice recognition software and may include unintentional dictation errors.   Nathaniel Man, MD 09/15/22 1631    Nathaniel Man, MD 09/15/22 1646

## 2022-09-15 NOTE — H&P (Addendum)
History and Physical   Javier Watson BBU:[id number] DOB: 23-Sep-1946 DOA: 09/15/2022  [facility]P: [facility] [place]  Patient coming from: Home  I have personally briefly reviewed patient's old medical records in [place].  Chief Concern: Weakness  HPI: Javier Watson is a 76 year old male with history of hypertension, hyperlipidemia, CKD 3B, OSA, depression, anxiety, chronic pain syndrome, who presents to the emergency department from home for chief concerns of lightheadedness and new bradycardia.  [facility].  Initial vitals in the emergency department showed temperature of 98.6, respiration rate of 20, heart rate of 62 with the lowest being 40, blood pressure 141/76, SPO2 of 95% on room air.  Serum sodium is 138, potassium 4.5, chloride 106, bicarb 27, BUN of 34, serum creatinine of 1.79, GFR 39, nonfasting blood glucose 114, WBC 5.9, hemoglobin 14.2, platelets of 148.  High sensitive troponin is 9.  EDP called cardiology, Dr. Clayborn Watson who has asked that the hospitalist call him if a cardiology consultation is desired.  ED treatment: None  At bedside, he is able to tell me his name, age, current location. He reports lightheaddness and tiredness that started about 1 week ago. It has worsened over the past weekend. He states he is not as energetic as he used to be.   He denies fever, cough, chest pain, nausea, vomiting, trauma to her person recently, syncope, or lost of consciousness.  Social history: He lives with his daughter. He denies tobacco, etoh, and recreational drug use. He is retired and formerly was a [employer].  ROS: Constitutional: no weight change, no fever ENT/Mouth: no sore throat, no rhinorrhea Eyes: no eye pain, no vision changes Cardiovascular: no chest pain, no dyspnea,  no edema, no palpitations Respiratory: no cough, no sputum, no wheezing Gastrointestinal: no nausea, no  vomiting, no diarrhea, no constipation Genitourinary: no urinary incontinence, no dysuria, no hematuria Musculoskeletal: no arthralgias, no myalgias Skin: no skin lesions, no pruritus, Neuro: + weakness, no loss of consciousness, no syncope Psych: no anxiety, no depression, no decrease appetite Heme/Lymph: no bruising, no bleeding  ED Course: Discussed with emergency medicine provider, patient requiring hospitalization for chief concerns of new complete heart block.  Assessment/Plan  Principal Problem:   Complete heart block (HCC) Active Problems:   Essential hypertension   Hyperglycemia   BPH (benign prostatic hyperplasia)   GERD (gastroesophageal reflux disease)   Obstructive sleep apnea   Shortness of breath   Nightmare disorder   Stage 3b chronic kidney disease (CKD) (HCC)   Assessment and Plan:  * Complete heart block (HCC) - Holding home diltiazem 360 mg daily, this has been held on admission - Complete echo ordered - Cardiology has been consulted  Stage 3b chronic kidney disease (CKD) ([place]) - At baseline  Nightmare disorder - With history of PTSD - Resumed home prazosin 5 mg nightly on admission, though this is an alpha-blocker can consider discontinuing if patient's bradycardia does not improve  Shortness of breath - Presumed secondary to new complete heart block - Complete echo ordered - Cardiology has been consulted via secure chat and epic order, to [facility] clinic, Dr. Clayborn Watson  Obstructive sleep apnea - CPAP nightly ordered  Hyperglycemia - Patient is not on diabetic medication as he states he was borderline  Essential hypertension - Resumed home losartan 100 mg daily, hydralazine 25 mg 3 times daily, chlorthalidone 50 mg daily  Chart reviewed.   DVT prophylaxis: Heparin  5000 units subcutaneous one-time dose ordered Code Status: Code Diet: Heart healthy Family Communication: No Disposition Plan: Pending clinical course Consults called:  Cardiology Admission status: Inpatient, progressive  Past Medical History:  Diagnosis Date   Anxiety    Arthritis    Cancer ([place])    PROSTATE   Chronic kidney disease    STAGE 3   Chronic pain    secondary to trauma   Depression    Diabetes mellitus without complication ([place])    patient follows diet only. no medications   Dysrhythmia    irregular heart beat   Essential hypertension    History of kidney stones    Keratoderma    Post traumatic stress disorder (PTSD)    Sleep apnea    C-PAP   Past Surgical History:  Procedure Laterality Date   Arm Surgery     BACK SURGERY  1967   Laminectomy on skull of neck   COLONOSCOPY     CRANIOTOMY  1967   CYSTOSCOPY W/ RETROGRADES Right 06/06/2019   Procedure: CYSTOSCOPY WITH RETROGRADE PYELOGRAM;  Surgeon: [name], MD;  Location: [facility] ORS;  Service: Urology;  Laterality: Right;   CYSTOSCOPY WITH URETEROSCOPY AND STENT PLACEMENT Right 01/2016   CYSTOSCOPY/URETEROSCOPY/HOLMIUM LASER/STENT PLACEMENT Right 05/09/2019   Procedure: CYSTOSCOPY/URETEROSCOPY/STENT PLACEMENT;  Surgeon: [name], MD;  Location: [facility] ORS;  Service: Urology;  Laterality: Right;   CYSTOSCOPY/URETEROSCOPY/HOLMIUM LASER/STENT PLACEMENT Right 06/06/2019   Procedure: CYSTOSCOPY/URETEROSCOPY/HOLMIUM LASER/STENT Exchange;  Surgeon: [name], MD;  Location: [facility] ORS;  Service: Urology;  Laterality: Right;   HYDROCELE EXCISION Right 10/21/2016   Procedure: HYDROCELECTOMY ADULT;  Surgeon: [name], MD;  Location: [facility] ORS;  Service: Urology;  Laterality: Right;   ORTHOPEDIC SURGERY     STONE EXTRACTION WITH BASKET  06/06/2019   Procedure: STONE EXTRACTION WITH BASKET;  Surgeon: [name], MD;  Location: [facility] ORS;  Service: Urology;;   TOTAL HIP ARTHROPLASTY Left 2006   URETEROSCOPY Right 04/2016   stent removal- spesis   Social History:  reports that he has never smoked. He has never used smokeless tobacco. He reports that he does not drink  alcohol and does not use drugs.  Allergies  Allergen Reactions   Acetaminophen Hives   Enalapril     Other reaction(s): Cough   Lisinopril     Other reaction(s): Cough   Family History  Problem Relation Age of Onset   Hypertension Father    Bladder Cancer Neg Hx    Prostate cancer Neg Hx    Kidney cancer Neg Hx    Family history: Family history reviewed and not pertinent.  Prior to Admission medications   Medication Sig Start Date End Date Taking? Authorizing Provider  ammonium lactate (LAC-HYDRIN) 12 % lotion  07/13/19  Yes [provider]  buPROPion (WELLBUTRIN SR) 100 MG 12 hr tablet Take 200 mg by mouth 2 (two) times daily. 02/08/16  Yes [provider]  citalopram (CELEXA) 40 MG tablet Take 40 mg by mouth every morning.  02/22/16  Yes [provider]  diclofenac Sodium (VOLTAREN) 1 % GEL Apply 2 g topically 4 (four) times daily. 01/27/22 01/28/23 Yes [provider]  diltiazem (TIAZAC) 360 MG 24 hr capsule Take 360 mg by mouth every morning.  09/24/16  Yes [provider]  docusate sodium (COLACE) 100 MG capsule Take 1 capsule (100 mg total) by mouth 2 (two) times daily. Patient taking differently: Take 100 mg by mouth daily. 10/21/16  Yes [name], MD  hydrALAZINE (APRESOLINE)  25 MG tablet Take 25 mg by mouth 3 (three) times daily.   Yes [provider]  hydrocortisone (ANUSOL-HC) 25 MG suppository Place rectally. 04/24/21  Yes [provider]  losartan (COZAAR) 100 MG tablet Take 1 tablet (100 mg total) by mouth at bedtime. 05/17/16  Yes Mody, Ulice Bold, MD  methocarbamol (ROBAXIN) 500 MG tablet Take 500 mg by mouth at bedtime. 02/19/21  Yes [provider]  potassium chloride SA (K-DUR,KLOR-CON) 20 MEQ tablet Take 1 tablet (20 mEq total) by mouth daily. Patient taking differently: Take 10 mEq by mouth daily. 05/16/16  Yes Theora Gianotti, NP  pravastatin (PRAVACHOL) 80 MG tablet Take by mouth. 10/01/20   Yes [provider]  prazosin (MINIPRESS) 5 MG capsule Take 5 mg by mouth at bedtime.  02/08/16  Yes [provider]  traZODone (DESYREL) 50 MG tablet  07/13/19  Yes [provider]  aspirin 81 MG EC tablet Take by mouth. Patient not taking: Reported on 09/15/2022 05/09/20   [provider]  chlorthalidone (HYGROTON) 50 MG tablet Take by mouth. 05/09/20   [provider]  Multiple Vitamin (MULTIVITAMIN WITH MINERALS) TABS tablet Take 1 tablet by mouth 3 (three) times a week.  Patient not taking: Reported on 09/15/2022    [provider]  Omega-3 Fatty Acids (FISH OIL) 1000 MG CAPS Take 1,000 mg by mouth 3 (three) times a week.  Patient not taking: Reported on 09/15/2022    [provider]  oxyCODONE (OXY IR/ROXICODONE) 5 MG immediate release tablet Take 5 mg by mouth 2 (two) times daily as needed. 05/10/20   [provider]  PROCTOSOL HC 2.5 % rectal cream Apply topically. 05/16/21   [provider]  sildenafil (REVATIO) 20 MG tablet 1-5 tablets 1 hour prior to intercourse. Patient not taking: Reported on 09/15/2022 12/25/21   [name], MD   Physical Exam: Vitals:   09/15/22 1545 09/15/22 1600 09/15/22 1800 09/15/22 2100  BP:  (!) 142/70 (!) 181/88 (!) 160/91  Pulse: (!) 41 (!) 41 (!) 56 (!) 57  Resp: 18 (!) '23 17 14  '$ Temp:   98.6 F (37 C) 98.6 F (37 C)  TempSrc:   Oral Oral  SpO2: 98% 97% 96% 97%  Weight:      Height:       Constitutional: appears age-appropriate, NAD, calm, comfortable Eyes: PERRL, lids and conjunctivae normal ENMT: Mucous membranes are moist. Posterior pharynx clear of any exudate or lesions. Age-appropriate dentition. Hearing appropriate Neck: normal, supple, no masses, no thyromegaly Respiratory: clear to auscultation bilaterally, no wheezing, no crackles. Normal respiratory effort. No accessory muscle use.  Cardiovascular: Bradycardia, no murmurs / rubs / gallops. No extremity  edema. 2+ pedal pulses. No carotid bruits.  Abdomen: no tenderness, no masses palpated, no hepatosplenomegaly. Bowel sounds positive.  Musculoskeletal: no clubbing / cyanosis. No joint deformity upper and lower extremities. Good ROM, no contractures, no atrophy. Normal muscle tone.  Skin: no rashes, lesions, ulcers. No induration Neurologic: Sensation intact. Strength 5/5 in all 4.  Psychiatric: Normal judgment and insight. Alert and oriented x 3. Normal mood.   EKG: independently reviewed, showing sinus bradycardia with rate of 44, QTc 371  Chest x-ray on Admission: I personally reviewed and I agree with radiologist reading as below.  DG Chest Port 1 View  Result Date: 09/15/2022 CLINICAL DATA:  Bradycardia.  Headache. EXAM: PORTABLE CHEST 1 VIEW COMPARISON:  02/27/2008 FINDINGS: Numerous leads and wires project over the chest. Apical lordotic positioning. External  pacer/defibrillator projects over the left hemithorax. Midline trachea. Cardiomegaly accentuated by AP portable technique. Atherosclerosis in the transverse aorta. Moderate right hemidiaphragm elevation. No pleural effusion or pneumothorax. No congestive failure. Given above technique limitations, no pulmonary consolidation identified. IMPRESSION: Cardiomegaly without congestive failure. Aortic Atherosclerosis (ICD10-I70.0). Electronically Signed   By: Abigail Miyamoto M.D.   On: 09/15/2022 15:34    Labs on Admission: I have personally reviewed following labs  CBC: Recent Labs  Lab 09/15/22 1436  WBC 5.9  NEUTROABS 3.9  HGB 14.2  HCT 42.6  MCV 88.9  PLT 830*   Basic Metabolic Panel: Recent Labs  Lab 09/15/22 1436  NA 138  K 4.5  CL 106  CO2 27  GLUCOSE 114*  BUN 34*  CREATININE 1.79*  CALCIUM 8.9  MG 2.1   GFR: Estimated Creatinine Clearance: 41.8 mL/min (A) (by C-G formula based on SCr of 1.79 mg/dL (H)).  Liver Function Tests: Recent Labs  Lab 09/15/22 1436  AST 23  ALT 25  ALKPHOS 66  BILITOT 0.8  PROT  6.7  ALBUMIN 3.7   Urine analysis:    Component Value Date/Time   COLORURINE RED (A) 04/25/2019 1050   APPEARANCEUR Cloudy (A) 05/31/2019 1541   LABSPEC 1.013 04/25/2019 1050   PHURINE 6.0 04/25/2019 1050   GLUCOSEU Negative 05/31/2019 1541   HGBUR LARGE (A) 04/25/2019 1050   BILIRUBINUR Negative 05/31/2019 1541   KETONESUR NEGATIVE 04/25/2019 1050   PROTEINUR 3+ (A) 05/31/2019 1541   PROTEINUR 100 (A) 04/25/2019 1050   NITRITE Positive (A) 05/31/2019 1541   NITRITE NEGATIVE 04/25/2019 1050   LEUKOCYTESUR Trace (A) 05/31/2019 1541   LEUKOCYTESUR NEGATIVE 04/25/2019 1050   Dr. Tobie Poet [facility]  If 7PM-7AM, please contact overnight-coverage provider If 7AM-7PM, please contact day coverage provider [email or web address]  09/15/2022, 11:17 PM

## 2022-09-15 NOTE — Assessment & Plan Note (Signed)
Secondary to symptoms from heart block.  This has improved.

## 2022-09-16 ENCOUNTER — Other Ambulatory Visit: Payer: Self-pay

## 2022-09-16 ENCOUNTER — Inpatient Hospital Stay: Admit: 2022-09-16 | Payer: No Typology Code available for payment source

## 2022-09-16 DIAGNOSIS — K219 Gastro-esophageal reflux disease without esophagitis: Secondary | ICD-10-CM

## 2022-09-16 DIAGNOSIS — N179 Acute kidney failure, unspecified: Secondary | ICD-10-CM | POA: Diagnosis not present

## 2022-09-16 DIAGNOSIS — F515 Nightmare disorder: Secondary | ICD-10-CM | POA: Diagnosis not present

## 2022-09-16 DIAGNOSIS — I442 Atrioventricular block, complete: Secondary | ICD-10-CM | POA: Diagnosis not present

## 2022-09-16 DIAGNOSIS — R0602 Shortness of breath: Secondary | ICD-10-CM

## 2022-09-16 DIAGNOSIS — N189 Chronic kidney disease, unspecified: Secondary | ICD-10-CM

## 2022-09-16 DIAGNOSIS — I10 Essential (primary) hypertension: Secondary | ICD-10-CM

## 2022-09-16 DIAGNOSIS — E669 Obesity, unspecified: Secondary | ICD-10-CM

## 2022-09-16 DIAGNOSIS — G4733 Obstructive sleep apnea (adult) (pediatric): Secondary | ICD-10-CM

## 2022-09-16 LAB — BASIC METABOLIC PANEL
Anion gap: 5 (ref 5–15)
BUN: 29 mg/dL — ABNORMAL HIGH (ref 8–23)
CO2: 25 mmol/L (ref 22–32)
Calcium: 8.2 mg/dL — ABNORMAL LOW (ref 8.9–10.3)
Chloride: 110 mmol/L (ref 98–111)
Creatinine, Ser: 1.34 mg/dL — ABNORMAL HIGH (ref 0.61–1.24)
GFR, Estimated: 55 mL/min — ABNORMAL LOW (ref >60)
Glucose, Bld: 94 mg/dL (ref 70–99)
Potassium: 3.3 mmol/L — ABNORMAL LOW (ref 3.5–5.1)
Sodium: 140 mmol/L (ref 135–145)

## 2022-09-16 LAB — CBC
HCT: 43.1 % (ref 39.0–52.0)
Hemoglobin: 14.2 g/dL (ref 13.0–17.0)
MCH: 29.3 pg (ref 26.0–34.0)
MCHC: 32.9 g/dL (ref 30.0–36.0)
MCV: 89 fL (ref 80.0–100.0)
Platelets: 119 10*3/uL — ABNORMAL LOW (ref 150–400)
RBC: 4.84 MIL/uL (ref 4.22–5.81)
RDW: 15.9 % — ABNORMAL HIGH (ref 11.5–15.5)
WBC: 5 10*3/uL (ref 4.0–10.5)
nRBC: 0 % (ref 0.0–0.2)

## 2022-09-16 LAB — MAGNESIUM: Magnesium: 2 mg/dL (ref 1.7–2.4)

## 2022-09-16 MED ORDER — POTASSIUM CHLORIDE CRYS ER 20 MEQ PO TBCR
40.0000 meq | EXTENDED_RELEASE_TABLET | Freq: Once | ORAL | Status: AC
  Administered 2022-09-16: 40 meq via ORAL
  Filled 2022-09-16: qty 2

## 2022-09-16 MED ORDER — POTASSIUM CHLORIDE CRYS ER 20 MEQ PO TBCR: 10.0000 meq | EXTENDED_RELEASE_TABLET | Freq: Every day | ORAL | Status: AC

## 2022-09-16 MED ORDER — DOCUSATE SODIUM 100 MG PO CAPS: 100.0000 mg | ORAL_CAPSULE | Freq: Every day | ORAL | Status: AC

## 2022-09-16 NOTE — Discharge Instructions (Signed)
Do not take Cardizem (tiazac)  Heart block resolved after stopping Cardizem

## 2022-09-16 NOTE — Evaluation (Signed)
Physical Therapy Evaluation Patient Details Name: Javier Watson MRN: [id number] DOB: May 12, 1946 Today's Date: 09/16/2022  History of Present Illness  Pt is a 76 y/o M admitted on 09/15/22 after presenting with c/o lightheadedness & new bradycardia. PMH: HTN, HLD, CKD3B, OSA, depression, anxiety, chronic pain syndrome, prostate CA, PTSD  Clinical Impression  Pt seen for PT evaluation with pt agreeable to tx. Pt reports prior to admission he was independent with PRN use of SPC in the home, did furniture walk, and used scooter outside of the home. Pt reports he lives with his daughter who works as a Pharmacist, hospital during the day, he still drives, & denies falls. On this date pt is able to complete bed mobility independently, transfer independently without AD & don shoes sitting in recliner. Pt ambulates with supervision fade to independence >300 ft with pt becoming more steady as he ambulates. Pt reports initial decreased balance was 2/2 not walking/being OOB for a day. Pt declines acute PT needs & reports he's ambulating better than his baseline. PT to complete current orders but educated pt on need to continue ambulating with staff. Will place mobility specialist referral.       Recommendations for follow up therapy are one component of a multi-disciplinary discharge planning process, led by the attending physician.  Recommendations may be updated based on patient status, additional functional criteria and insurance authorization.  Follow Up Recommendations No PT follow up      Assistance Recommended at Discharge PRN  Patient can return home with the following       Equipment Recommendations None recommended by PT  Recommendations for Other Services       Functional Status Assessment Patient has not had a recent decline in their functional status     Precautions / Restrictions Precautions Precautions: None Restrictions Weight Bearing Restrictions: No      Mobility  Bed Mobility Overal  bed mobility: Modified Independent                  Transfers Overall transfer level: Independent Equipment used: None               General transfer comment: STS & step pivot to recliner without AD & independently    Ambulation/Gait Ambulation/Gait assistance: Supervision, Independent (supervision fade to independence, pt with 1 LOB but able to recover) Gait Distance (Feet):  (>300 ft) Assistive device: None   Gait velocity: slightly decreased     General Gait Details: Pt initially with 1 LOB but able to recover, reports initial impaired balance 2/2 not ambulating in a day or so. Pt progressed to ambulating independently without AD.  Stairs            Wheelchair Mobility    Modified Rankin (Stroke Patients Only)       Balance Overall balance assessment: Needs assistance Sitting-balance support: Feet supported Sitting balance-Leahy Scale: Good     Standing balance support: During functional activity, No upper extremity supported Standing balance-Leahy Scale: Good                               Pertinent Vitals/Pain Pain Assessment Pain Assessment: No/denies pain    Home Living Family/patient expects to be discharged to:: Private residence Living Arrangements: Children Available Help at Discharge: Family;Available PRN/intermittently (daughter works as Education officer, museum during the day) Type of Home: [employer] Access: Stairs to enter Entrance Stairs-Rails: Right;Left (wideset) Entrance Stairs-Number of Steps: 2-3  Home Layout: One level Home Equipment: Cane - single point (scooter)      Prior Function Prior Level of Function : Independent/Modified Independent;Driving             Mobility Comments: Pt denies falls, reports he furniture walks or uses SPC PRN, uses scooter out of the home ADLs Comments: Bathes & dresses without assistance.     Hand Dominance        Extremity/Trunk Assessment   Upper Extremity  Assessment Upper Extremity Assessment: Overall WFL for tasks assessed    Lower Extremity Assessment Lower Extremity Assessment: Overall WFL for tasks assessed       Communication   Communication: No difficulties  Cognition Arousal/Alertness: Awake/alert Behavior During Therapy: WFL for tasks assessed/performed Overall Cognitive Status: Within Functional Limits for tasks assessed                                          General Comments General comments (skin integrity, edema, etc.): HR 64 bpm at rest, increased to 84 bpm with gait.    Exercises     Assessment/Plan    PT Assessment Patient does not need any further PT services  PT Problem List         PT Treatment Interventions      PT Goals (Current goals can be found in the Care Plan section)  Acute Rehab PT Goals Patient Stated Goal: get better medically PT Goal Formulation: With patient Time For Goal Achievement: 09/30/22 Potential to Achieve Goals: Good    Frequency       Co-evaluation               AM-PAC PT "6 Clicks" Mobility  Outcome Measure Help needed turning from your back to your side while in a flat bed without using bedrails?: None   Help needed moving to and from a bed to a chair (including a wheelchair)?: None Help needed standing up from a chair using your arms (e.g., wheelchair or bedside chair)?: None Help needed to walk in hospital room?: None Help needed climbing 3-5 steps with a railing? : None 6 Click Score: 20    End of Session   Activity Tolerance: Patient tolerated treatment well Patient left: in chair;with call bell/phone within reach        Time: 0949-1007 PT Time Calculation (min) (ACUTE ONLY): 18 min   Charges:   PT Evaluation $PT Eval Moderate Complexity: [place], PT, DPT 09/16/22, 10:23 AM   Waunita Schooner 09/16/2022, 10:21 AM

## 2022-09-16 NOTE — Discharge Summary (Signed)
Physician Discharge Summary   Patient: Javier Watson MRN: [id number] DOB: 1946-02-21  Admit date:     09/15/2022  Discharge date: 09/16/22  Discharge Physician: Loletha Grayer   PCP: [place]   Recommendations at discharge:   Follow-up PCP 5 days Follow-up Dr. Clayborn Bigness cardiology in 1 week  Discharge Diagnoses: Principal Problem:   Complete heart block ([place]) Active Problems:   Acute kidney injury superimposed on CKD ([place])   Essential hypertension   BPH (benign prostatic hyperplasia)   GERD (gastroesophageal reflux disease)   Hypokalemia   Obstructive sleep apnea   Shortness of breath   Nightmare disorder   Obesity (BMI 30-39.9)    Hospital Course: Mr. Javier Watson is a 76 year old male with history of hypertension, hyperlipidemia, CKD 3B, OSA, depression, anxiety, chronic pain syndrome, who presents to the emergency department from home for chief concerns of lightheadedness and new bradycardia.  [facility] for further evaluation.  Initial vitals in the emergency department showed temperature of 98.6, respiration rate of 20, heart rate of 62 with the lowest being 40, blood pressure 141/76, SPO2 of 95% on room air.  Serum sodium is 138, potassium 4.5, chloride 106, bicarb 27, BUN of 34, serum creatinine of 1.79, GFR 39, nonfasting blood glucose 114, WBC 5.9, hemoglobin 14.2, platelets of 148.  High sensitive troponin is 9.  EDP called cardiology, Dr. Clayborn Bigness who has asked that the hospitalist call him if a cardiology consultation is desired.  On 09/16/2022, patient was feeling much better.  His Cardizem was held.  On telemetry monitoring EKG did have first-degree AV block and was in sinus rhythm.  Cardiology cleared to go home and holding all rate controlling medications.  Patient improved much quicker than expected.  Assessment and Plan: * Complete heart block (HCC) - Holding home diltiazem 360 mg daily,  this has been held on admission -On telemetry monitoring and EKG first-degree AV block seen.  Complete heart block has resolved holding rate controlling medications.  Cardiology cleared for discharge home.  Would not prescribe any rate controlling medications at this point.  Acute kidney injury superimposed on CKD ([place]) Creatinine 1.79 on presentation down to 1.34 upon discharge.  Acute kidney injury on CKD stage IIIa.  Obesity (BMI 30-39.9) BMI 34.97  Nightmare disorder - With history of PTSD - Resumed home prazosin 5 mg nightly on admission, though this is an alpha-blocker can consider discontinuing if patient's bradycardia does not improve  Shortness of breath Secondary to symptoms from heart block.  This has improved.  Obstructive sleep apnea - CPAP nightly ordered  Hypokalemia Watch closely with chlorthalidone.  Continue potassium replacement.  Recommend checking a BMP and follow-up appointment.  Essential hypertension - Resumed home losartan 100 mg daily, hydralazine 25 mg 3 times daily, chlorthalidone 50 mg daily         Consultants: Cardiology Procedures performed: None Disposition: Home Diet recommendation:  Cardiac diet DISCHARGE MEDICATION: Allergies as of 09/16/2022       Reactions   Acetaminophen Hives   Enalapril    Other reaction(s): Cough   Lisinopril    Other reaction(s): Cough        Medication List     STOP taking these medications    diltiazem 360 MG 24 hr capsule Commonly known as: TIAZAC   Fish Oil 1000 MG Caps   multivitamin with minerals Tabs tablet   sildenafil 20 MG tablet Commonly known as: REVATIO  TAKE these medications    ammonium lactate 12 % lotion Commonly known as: LAC-HYDRIN   aspirin EC 81 MG tablet Take by mouth.   buPROPion ER 100 MG 12 hr tablet Commonly known as: WELLBUTRIN SR Take 200 mg by mouth 2 (two) times daily.   chlorthalidone 50 MG tablet Commonly known as: HYGROTON Take 50 mg by  mouth daily.   citalopram 40 MG tablet Commonly known as: CELEXA Take 40 mg by mouth every morning.   diclofenac Sodium 1 % Gel Commonly known as: VOLTAREN Apply 2 g topically 4 (four) times daily.   docusate sodium 100 MG capsule Commonly known as: COLACE Take 1 capsule (100 mg total) by mouth daily.   hydrALAZINE 25 MG tablet Commonly known as: APRESOLINE Take 25 mg by mouth 3 (three) times daily.   hydrocortisone 25 MG suppository Commonly known as: ANUSOL-HC Place rectally.   losartan 100 MG tablet Commonly known as: COZAAR Take 1 tablet (100 mg total) by mouth at bedtime.   methocarbamol 500 MG tablet Commonly known as: ROBAXIN Take 500 mg by mouth at bedtime.   oxyCODONE 5 MG immediate release tablet Commonly known as: Oxy IR/ROXICODONE Take 5 mg by mouth 2 (two) times daily as needed.   potassium chloride SA 20 MEQ tablet Commonly known as: KLOR-CON M Take 0.5 tablets (10 mEq total) by mouth daily.   pravastatin 80 MG tablet Commonly known as: PRAVACHOL Take 80 mg by mouth daily.   prazosin 5 MG capsule Commonly known as: MINIPRESS Take 5 mg by mouth at bedtime.   Proctosol HC 2.5 % rectal cream Generic drug: hydrocortisone Apply topically.   traZODone 50 MG tablet Commonly known as: DESYREL Take 50 mg by mouth at bedtime.        Follow-up Information     Yolonda Kida, MD. Go in 1 week(s).   Specialties: Cardiology, Internal Medicine Contact information: [place] [address] [place] Follow up in 5 day(s).   Specialty: General Practice Contact information: [place] [address] [phone]                Discharge Exam: Danley Danker Weights   09/15/22 1426  Weight: 104.3 kg   Physical Exam HENT:     Head: Normocephalic.     Mouth/Throat:     Pharynx: No oropharyngeal exudate.  Eyes:     General: Lids are normal.     Conjunctiva/sclera:  Conjunctivae normal.  Cardiovascular:     Rate and Rhythm: Normal rate and regular rhythm.     Heart sounds: Normal heart sounds, S1 normal and S2 normal.  Pulmonary:     Breath sounds: No decreased breath sounds, wheezing, rhonchi or rales.  Abdominal:     Palpations: Abdomen is soft.     Tenderness: There is no abdominal tenderness.  Musculoskeletal:     Right lower leg: Swelling present.     Left lower leg: Swelling present.  Skin:    General: Skin is warm.     Findings: No rash.  Neurological:     Mental Status: He is alert and oriented to person, place, and time.      Condition at discharge: stable  The results of significant diagnostics from this hospitalization (including imaging, microbiology, ancillary and laboratory) are listed below for reference.   Imaging Studies: DG Chest Port 1 View  Result Date: 09/15/2022 CLINICAL DATA:  Bradycardia.  Headache. EXAM: PORTABLE CHEST 1 VIEW COMPARISON:  02/27/2008 FINDINGS: Numerous leads and wires project over the chest. Apical lordotic positioning. External pacer/defibrillator projects over the left hemithorax. Midline trachea. Cardiomegaly accentuated by AP portable technique. Atherosclerosis in the transverse aorta. Moderate right hemidiaphragm elevation. No pleural effusion or pneumothorax. No congestive failure. Given above technique limitations, no pulmonary consolidation identified. IMPRESSION: Cardiomegaly without congestive failure. Aortic Atherosclerosis (ICD10-I70.0). Electronically Signed   By: Abigail Miyamoto M.D.   On: 09/15/2022 15:34    Microbiology: Results for orders placed or performed during the hospital encounter of 06/02/19  SARS Coronavirus 2 (Performed in [place] hospital lab)     Status: None   Collection Time: 06/02/19  9:24 AM   Specimen: Nasal Swab  Result Value Ref Range Status   SARS Coronavirus 2 NEGATIVE NEGATIVE Final    Comment: (NOTE) SARS-CoV-2 target nucleic acids are NOT DETECTED. The  SARS-CoV-2 RNA is generally detectable in upper and lower respiratory specimens during the acute phase of infection. Negative results do not preclude SARS-CoV-2 infection, do not rule out co-infections with other pathogens, and should not be used as the sole basis for treatment or other patient management decisions. Negative results must be combined with clinical observations, patient history, and epidemiological information. The expected result is Negative. Fact Sheet for Patients: SugarRoll.be Fact Sheet for Healthcare Providers: https://www.woods-mathews.com/ This test is not yet approved or cleared by the [place] FDA and  has been authorized for detection and/or diagnosis of SARS-CoV-2 by FDA under an Emergency Use Authorization (EUA). This EUA will remain  in effect (meaning this test can be used) for the duration of the COVID-19 declaration under Section 56 4(b)(1) of the Act, 21 U.S.C. section 360bbb-3(b)(1), unless the authorization is terminated or revoked sooner. Performed at [facility], [place] [address], [place], [place] [address]     Labs: CBC: Recent Labs  Lab 09/15/22 1436 09/16/22 0508  WBC 5.9 5.0  NEUTROABS 3.9  --   HGB 14.2 14.2  HCT 42.6 43.1  MCV 88.9 89.0  PLT 148* 833*   Basic Metabolic Panel: Recent Labs  Lab 09/15/22 1436 09/16/22 0508  NA 138 140  K 4.5 3.3*  CL 106 110  CO2 27 25  GLUCOSE 114* 94  BUN 34* 29*  CREATININE 1.79* 1.34*  CALCIUM 8.9 8.2*  MG 2.1 2.0   Liver Function Tests: Recent Labs  Lab 09/15/22 1436  AST 23  ALT 25  ALKPHOS 66  BILITOT 0.8  PROT 6.7  ALBUMIN 3.7   CBG: No results for input(s): "GLUCAP" in the last 168 hours.  Discharge time spent: greater than 30 minutes.  Signed: Loletha Grayer, MD [facility] 09/16/2022

## 2022-09-16 NOTE — Assessment & Plan Note (Signed)
Creatinine 1.79 on presentation down to 1.34 upon discharge.  Acute kidney injury on CKD stage IIIa.

## 2022-09-16 NOTE — Assessment & Plan Note (Signed)
BMI 34.97

## 2022-09-16 NOTE — Assessment & Plan Note (Signed)
Watch closely with chlorthalidone.  Continue potassium replacement.  Recommend checking a BMP and follow-up appointment.

## 2022-12-25 ENCOUNTER — Other Ambulatory Visit: Payer: 59

## 2022-12-25 DIAGNOSIS — C61 Malignant neoplasm of prostate: Secondary | ICD-10-CM

## 2022-12-26 LAB — PSA: Prostate Specific Ag, Serum: 10.1 ng/mL — ABNORMAL HIGH (ref 0.0–4.0)

## 2022-12-31 ENCOUNTER — Ambulatory Visit: Payer: 59 | Admitting: Urology

## 2022-12-31 VITALS — BP 161/77 | HR 54 | Ht 68.0 in | Wt 246.4 lb

## 2022-12-31 DIAGNOSIS — N401 Enlarged prostate with lower urinary tract symptoms: Secondary | ICD-10-CM | POA: Diagnosis not present

## 2022-12-31 DIAGNOSIS — C61 Malignant neoplasm of prostate: Secondary | ICD-10-CM | POA: Diagnosis not present

## 2022-12-31 DIAGNOSIS — K649 Unspecified hemorrhoids: Secondary | ICD-10-CM

## 2022-12-31 DIAGNOSIS — N1330 Unspecified hydronephrosis: Secondary | ICD-10-CM | POA: Diagnosis not present

## 2022-12-31 DIAGNOSIS — R350 Frequency of micturition: Secondary | ICD-10-CM

## 2022-12-31 DIAGNOSIS — N131 Hydronephrosis with ureteral stricture, not elsewhere classified: Secondary | ICD-10-CM

## 2022-12-31 DIAGNOSIS — N528 Other male erectile dysfunction: Secondary | ICD-10-CM | POA: Diagnosis not present

## 2022-12-31 MED ORDER — SILDENAFIL CITRATE 20 MG PO TABS: 20.0000 mg | ORAL_TABLET | Freq: Every day | ORAL | 11 refills | Status: AC

## 2022-12-31 NOTE — Progress Notes (Signed)
I, Javier Watson,acting as a scribe for Javier Espy, MD.,have documented all relevant documentation on the behalf of Javier Espy, MD,as directed by  Javier Espy, MD while in the presence of Javier Espy, MD.   I, Javier Watson as a scribe for Javier Espy, MD.,have documented all relevant documentation on the behalf of Javier Espy, MD,as directed by  Javier Espy, MD while in the presence of Javier Espy, MD.   12/31/22 5:49 PM   Javier Watson Mar 24, 1946 [id number]  Referring provider: [facility], [facility] [address] [place],  [place] [address]  Chief Complaint  Patient presents with   Benign Prostatic Hypertrophy   Prostate Cancer    HPI: 77 year-old male with a personal history chronic right hydronephrosis secondary to ureteral stricture, stage 3 CKD, BPH with LUTS, and low risk prostate cancer on active surveillance who returns today for a routine annual follow-up.   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 He recently was treated for a stone near the level of the obstruction successfully. Hydronephrosis on the side is severe and chronic.  He has had several Lasix renal scans including 2017 at the [facility] which showed prompt excretion of contrast material without a functional obstruction. Lasix scan was repeated by me on 10/21/2019 which shows his right kidney at 44% function and prompt excretion of contrast material.     He has not had any recent upper tract imaging. His creatinine was below his previous baseline now at 1.34.   He had a prostate MRI in 2021 at the [place] which showed a volume of 217. His most recent PSA remains stable at 10.1 on 12/25/22.  He denies flank pain or issues related to hydronephrosis. He notes some frequency but not overly bothersome. He has not been taking Sildenafil for erectile dysfunction.   Additionally, he mentioned using a stool softener, possibly Metamucil for hemorrhoids, which has helped reduce  rectal bleeding by making his stools softer.  PSA trend:  Prostate Specific Ag, Serum  Latest Ref Rng 0.0 - 4.0 ng/mL  05/19/2019 10.5 (H)   11/22/2019 9.6 (H)   05/18/2020 10.5 (H)   11/27/2020 12.4 (H)   06/04/2021 10.4 (H)   12/17/2021 10.2 (H)   06/30/2022 10.1 (H)   12/25/2022 10.1 (H)      PMH: Past Medical History:  Diagnosis Date   Anxiety    Arthritis    Cancer ([place])    PROSTATE   Chronic kidney disease    STAGE 3   Chronic pain    secondary to trauma   Depression    Diabetes mellitus without complication ([place])    patient follows diet only. no medications   Dysrhythmia    irregular heart beat   Essential hypertension    History of kidney stones    Keratoderma    Post traumatic stress disorder (PTSD)    Sleep apnea    C-PAP    Surgical History: Past Surgical History:  Procedure Laterality Date   Arm Surgery     BACK SURGERY  1967   Laminectomy on skull of neck   COLONOSCOPY     CRANIOTOMY  1967   CYSTOSCOPY W/ RETROGRADES Right 06/06/2019   Procedure: CYSTOSCOPY WITH RETROGRADE PYELOGRAM;  Surgeon: Javier Espy, MD;  Location: [facility] OR[facility];  Service: Urology;  Laterality: Right;   CYSTOSCOPY WITH URETEROSCOPY AND STENT PLACEMENT Right 01/2016   CYSTOSCOPY/URETEROSCOPY/HOLMIUM LASER/STENT PLACEMENT Right 05/09/2019   Procedure: CYSTOSCOPY/URETEROSCOPY/STENT PLACEMENT;  Surgeon: Erlene Quan,  Caryl Pina, MD;  Location: [facility];  Service: Urology;  Laterality: Right;   CYSTOSCOPY/URETEROSCOPY/HOLMIUM LASER/STENT PLACEMENT Right 06/06/2019   Procedure: CYSTOSCOPY/URETEROSCOPY/HOLMIUM LASER/STENT Exchange;  Surgeon: Javier Espy, MD;  Location: [facility];  Service: Urology;  Laterality: Right;   HYDROCELE EXCISION Right 10/21/2016   Procedure: HYDROCELECTOMY ADULT;  Surgeon: Javier Espy, MD;  Location: [facility] OR[facility];  Service: Urology;  Laterality: Right;   ORTHOPEDIC SURGERY     STONE EXTRACTION WITH BASKET  06/06/2019   Procedure: STONE EXTRACTION WITH BASKET;  Surgeon: Javier Espy, MD;  Location: [facility];  Service: Urology;;   TOTAL HIP ARTHROPLASTY Left 2006   URETEROSCOPY Right 04/2016   stent removal- spesis    Home Medications:  Allergies as of 12/31/2022       Reactions   Acetaminophen Hives   Enalapril    Other reaction(s): Cough   Lisinopril    Other reaction(s): Cough        Medication List        Accurate as of December 31, 2022  5:49 PM. If you have any questions, ask your nurse or doctor.          ammonium lactate 12 % lotion Commonly known as: LAC-HYDRIN   aspirin EC 81 MG tablet Take by mouth.   buPROPion ER 100 MG 12 hr tablet Commonly known as: WELLBUTRIN SR Take 200 mg by mouth 2 (two) times daily.   chlorthalidone 50 MG tablet Commonly known as: HYGROTON Take 50 mg by mouth daily.   citalopram 40 MG tablet Commonly known as: CELEXA Take 40 mg by mouth every morning.   diclofenac Sodium 1 % Gel Commonly known as: VOLTAREN Apply 2 g topically 4 (four) times daily.   docusate sodium 100 MG capsule Commonly known as: COLACE Take 1 capsule (100 mg total) by mouth daily.   hydrALAZINE 25 MG tablet Commonly known as: APRESOLINE Take 25 mg by mouth 3 (three) times daily.   hydrocortisone 25 MG suppository Commonly known as: ANUSOL-HC Place rectally.   losartan 100 MG tablet Commonly known as: COZAAR Take 1 tablet (100 mg total) by mouth at bedtime.   methocarbamol 500 MG tablet Commonly known as: ROBAXIN Take 500 mg by mouth at bedtime.   oxyCODONE 5 MG immediate release tablet Commonly known as: Oxy IR/ROXICODONE Take 5 mg by mouth 2 (two) times daily as needed.   potassium chloride SA 20 MEQ tablet Commonly known as: KLOR-CON M Take 0.5 tablets (10 mEq total) by mouth daily.   pravastatin 80 MG tablet Commonly known as: PRAVACHOL Take 80 mg by mouth daily.   prazosin 5 MG capsule Commonly known as: MINIPRESS Take 5 mg by mouth at bedtime.   Proctosol HC 2.5 % rectal cream Generic drug:  hydrocortisone Apply topically.   sildenafil 20 MG tablet Commonly known as: Revatio Take 1 tablet (20 mg total) by mouth daily.   traZODone 50 MG tablet Commonly known as: DESYREL Take 50 mg by mouth at bedtime.        Allergies:  Allergies  Allergen Reactions   Acetaminophen Hives   Enalapril     Other reaction(s): Cough   Lisinopril     Other reaction(s): Cough    Family History: Family History  Problem Relation Age of Onset   Hypertension Father    Bladder Cancer Neg Hx    Prostate cancer Neg Hx    Kidney cancer Neg Hx     Social History:  reports that he has never smoked. He  has never used smokeless tobacco. He reports that he does not drink alcohol and does not use drugs.   Physical Exam: BP (!) 161/77   Pulse (!) 54   Ht 5' 8"$  (1.727 m)   Wt 246 lb 6 oz (111.8 kg)   BMI 37.46 kg/m   Constitutional:  Alert and oriented, No acute distress. HEENT: [place] AT, moist mucus membranes.  Trachea midline, no masses. GU: enlarged prostate, no nodules, non tender Neurologic: Grossly intact, no focal deficits, moving all 4 extremities. Psychiatric: Normal mood and affect.  Laboratory Data: Lab Results  Component Value Date   CREATININE 1.34 (H) 09/16/2022    Assessment & Plan:    1. Right hydronephrosis - His creatinine has improved and is asymptomatic. He's had extensive work up in the past. Burnis Medin continue conservative management.   2. BPH - No treatment indicated at this time due to lack of urinary symptoms   3. Erectile Dysfunction - Continue Sildenafil 20-100 mg; he reports that last year he never picked up this prescription and would like it sent.  We reviewed how to take this medication for optimal effect, dose titration as well as possible side effects.  4. Low risk prostate cancer - Continue active surveillance. PSA stable.  - Repeat labs in six months and follow-up visit in one year with PSA and DRE.  5. Hemorrhoids - Advised continuation of  stool softener, possibly Metamucil, to manage symptoms.  Return in about 1 year (around 01/01/2024) for with PSA and DRE.  I have reviewed the above documentation for accuracy and completeness, and I agree with the above.   Javier Espy, MD   [facility] [address], [place], [place] [address] [phone]

## 2023-07-01 ENCOUNTER — Other Ambulatory Visit: Payer: 59

## 2023-07-01 DIAGNOSIS — C61 Malignant neoplasm of prostate: Secondary | ICD-10-CM

## 2023-07-02 LAB — PSA: Prostate Specific Ag, Serum: 12 ng/mL — ABNORMAL HIGH (ref 0.0–4.0)

## 2023-12-25 ENCOUNTER — Other Ambulatory Visit: Payer: Non-veteran care

## 2023-12-25 DIAGNOSIS — C61 Malignant neoplasm of prostate: Secondary | ICD-10-CM

## 2023-12-26 LAB — PSA: Prostate Specific Ag, Serum: 11.3 ng/mL — ABNORMAL HIGH (ref 0.0–4.0)

## 2023-12-30 ENCOUNTER — Ambulatory Visit (INDEPENDENT_AMBULATORY_CARE_PROVIDER_SITE_OTHER): Payer: 59 | Admitting: Urology

## 2023-12-30 VITALS — BP 158/89 | HR 76 | Ht 68.0 in | Wt 230.0 lb

## 2023-12-30 DIAGNOSIS — N1339 Other hydronephrosis: Secondary | ICD-10-CM

## 2023-12-30 DIAGNOSIS — C61 Malignant neoplasm of prostate: Secondary | ICD-10-CM

## 2023-12-30 NOTE — Progress Notes (Signed)
Marcelle Overlie Plume,acting as a scribe for Vanna Scotland, MD.,have documented all relevant documentation on the behalf of Vanna Scotland, MD,as directed by  Vanna Scotland, MD while in the presence of Vanna Scotland, MD.  12/30/23 11:00 AM   Javier Watson 08/02/1946 [id number]  Referring provider: Center, [facility] [address] [place],  [place] [address]  Chief Complaint  Patient presents with   Prostate Cancer    HPI: 78 year-old male who returns today for a routine annual follow with PSA and DRE.   He is primarily followed by the [place]. He has a personal history of low-risk prostate cancer and active surveillance, chronic right hydronephrosis secondary to ureteral stricture, stage 3 CKD, and BPH with LUTS.   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 He recently was treated for a stone near the level of the obstruction successfully. Hydronephrosis on the side is severe and chronic.  He has had several Lasix renal scans including 2017 at the [facility] which showed prompt excretion of contrast material without a functional obstruction. Lasix scan was repeated by me on 10/21/2019 which shows his right kidney at 44% function and prompt excretion of contrast material.     He had a prostate MRI in 2021 at the [facility] which showed a volume of 217. His most recent PSA remains stable at 10.1 on 12/25/22.   His most recent PSA on 12/25/2023 was 11.3 and remains stable and unchanged.   He reports a lack of energy and has gained some weight. He is considering dietary changes and is concerned about the impact of energy drinks on blood pressure. He is not currently experiencing any urinary issues, such as difficulty urinating or nocturia.   Prostate Specific Ag, Serum  Latest Ref Rng 0.0 - 4.0 ng/mL  05/19/2019 10.5 (H)   11/22/2019 9.6 (H)   05/18/2020 10.5 (H)   11/27/2020 12.4 (H)   06/04/2021 10.4 (H)   12/17/2021 10.2 (H)   06/30/2022 10.1 (H)   12/25/2022 10.1 (H)   07/01/2023 12.0 (H)    12/25/2023 11.3 (H)      PMH: Past Medical History:  Diagnosis Date   Anxiety    Arthritis    Cancer (HCC)    PROSTATE   Chronic kidney disease    STAGE 3   Chronic pain    secondary to trauma   Depression    Diabetes mellitus without complication (HCC)    patient follows diet only. no medications   Dysrhythmia    irregular heart beat   Essential hypertension    History of kidney stones    Keratoderma    Post traumatic stress disorder (PTSD)    Sleep apnea    C-PAP    Surgical History: Past Surgical History:  Procedure Laterality Date   Arm Surgery     BACK SURGERY  1967   Laminectomy on skull of neck   COLONOSCOPY     CRANIOTOMY  1967   CYSTOSCOPY W/ RETROGRADES Right 06/06/2019   Procedure: CYSTOSCOPY WITH RETROGRADE PYELOGRAM;  Surgeon: Vanna Scotland, MD;  Location: [facility];  Service: Urology;  Laterality: Right;   CYSTOSCOPY WITH URETEROSCOPY AND STENT PLACEMENT Right 01/2016   CYSTOSCOPY/URETEROSCOPY/HOLMIUM LASER/STENT PLACEMENT Right 05/09/2019   Procedure: CYSTOSCOPY/URETEROSCOPY/STENT PLACEMENT;  Surgeon: Vanna Scotland, MD;  Location: [facility];  Service: Urology;  Laterality: Right;   CYSTOSCOPY/URETEROSCOPY/HOLMIUM LASER/STENT PLACEMENT Right 06/06/2019   Procedure: CYSTOSCOPY/URETEROSCOPY/HOLMIUM LASER/STENT Exchange;  Surgeon: Vanna Scotland, MD;  Location: [facility];  Service: Urology;  Laterality: Right;   HYDROCELE EXCISION Right 10/21/2016   Procedure: HYDROCELECTOMY ADULT;  Surgeon: Vanna Scotland, MD;  Location: [facility];  Service: Urology;  Laterality: Right;   ORTHOPEDIC SURGERY     STONE EXTRACTION WITH BASKET  06/06/2019   Procedure: STONE EXTRACTION WITH BASKET;  Surgeon: Vanna Scotland, MD;  Location: [facility];  Service: Urology;;   TOTAL HIP ARTHROPLASTY Left 2006   URETEROSCOPY Right 04/2016   stent removal- spesis    Home Medications:  Allergies as of 12/30/2023       Reactions   Acetaminophen Hives   Enalapril    Other reaction(s):  Cough   Lisinopril    Other reaction(s): Cough        Medication List        Accurate as of December 30, 2023 11:00 AM. If you have any questions, ask your nurse or doctor.          ammonium lactate 12 % lotion Commonly known as: LAC-HYDRIN   aspirin EC 81 MG tablet Take by mouth.   buPROPion ER 100 MG 12 hr tablet Commonly known as: WELLBUTRIN SR Take 200 mg by mouth 2 (two) times daily.   chlorthalidone 50 MG tablet Commonly known as: HYGROTON Take 50 mg by mouth daily.   citalopram 40 MG tablet Commonly known as: CELEXA Take 40 mg by mouth every morning.   docusate sodium 100 MG capsule Commonly known as: COLACE Take 1 capsule (100 mg total) by mouth daily.   hydrALAZINE 25 MG tablet Commonly known as: APRESOLINE Take 25 mg by mouth 3 (three) times daily.   hydrocortisone 25 MG suppository Commonly known as: ANUSOL-HC Place rectally.   losartan 100 MG tablet Commonly known as: COZAAR Take 1 tablet (100 mg total) by mouth at bedtime.   methocarbamol 500 MG tablet Commonly known as: ROBAXIN Take 500 mg by mouth at bedtime.   oxyCODONE 5 MG immediate release tablet Commonly known as: Oxy IR/ROXICODONE Take 5 mg by mouth 2 (two) times daily as needed.   potassium chloride SA 20 MEQ tablet Commonly known as: KLOR-CON M Take 0.5 tablets (10 mEq total) by mouth daily.   pravastatin 80 MG tablet Commonly known as: PRAVACHOL Take 80 mg by mouth daily.   prazosin 5 MG capsule Commonly known as: MINIPRESS Take 5 mg by mouth at bedtime.   Proctosol HC 2.5 % rectal cream Generic drug: hydrocortisone Apply topically.   sildenafil 20 MG tablet Commonly known as: Revatio Take 1 tablet (20 mg total) by mouth daily.   traZODone 50 MG tablet Commonly known as: DESYREL Take 50 mg by mouth at bedtime.        Allergies:  Allergies  Allergen Reactions   Acetaminophen Hives   Enalapril     Other reaction(s): Cough   Lisinopril     Other  reaction(s): Cough    Family History: Family History  Problem Relation Age of Onset   Hypertension Father    Bladder Cancer Neg Hx    Prostate cancer Neg Hx    Kidney cancer Neg Hx     Social History:  reports that he has never smoked. He has never used smokeless tobacco. He reports that he does not drink alcohol and does not use drugs.   Physical Exam: BP (!) 158/89   Pulse 76   Ht 5\' 8"  (1.727 m)   Wt 230 lb (104.3 kg)   BMI 34.97 kg/m   Constitutional:  Alert and oriented,  No acute distress. HEENT: [place] AT, moist mucus membranes.  Trachea midline, no masses. GU:  Prostate is noted to be enlarged but stable. Neurologic: Grossly intact, no focal deficits, moving all 4 extremities. Psychiatric: Normal mood and affect.   Assessment & Plan:    1. Prostate cancer - PSA is stable and unchanged - Most strongly recommend continued active surveillance  2. Chronic hydronephrosis - No interval renal imaging - No recent labs but states they are stable from [place] - This has been worked up extensively for many years, without recommending any further intervention unless it becomes symptomatic or his renal function worsens   Return in about 1 year (around 12/29/2024) for PSA and DRE.  I have reviewed the above documentation for accuracy and completeness, and I agree with the above.   Vanna Scotland, MD    [facility] [address], [address] [place], [place] [address] [phone]

## 2023-12-30 NOTE — Progress Notes (Signed)
Javier Watson is

## 2024-01-20 ENCOUNTER — Encounter: Payer: Self-pay | Admitting: Emergency Medicine

## 2024-01-20 ENCOUNTER — Other Ambulatory Visit: Payer: Self-pay

## 2024-01-20 ENCOUNTER — Emergency Department
Admission: EM | Admit: 2024-01-20 | Discharge: 2024-01-20 | Disposition: A | Discharge status: Home / Self Care | Attending: Emergency Medicine | Admitting: Emergency Medicine

## 2024-01-20 DIAGNOSIS — R338 Other retention of urine: Secondary | ICD-10-CM | POA: Diagnosis present

## 2024-01-20 LAB — URINALYSIS, ROUTINE W REFLEX MICROSCOPIC
Bilirubin Urine: NEGATIVE
Glucose, UA: NEGATIVE mg/dL
Hgb urine dipstick: NEGATIVE
Ketones, ur: 5 mg/dL — AB
Leukocytes,Ua: NEGATIVE
Nitrite: NEGATIVE
Protein, ur: 30 mg/dL — AB
Specific Gravity, Urine: 1.018 (ref 1.005–1.030)
Squamous Epithelial / HPF: 0 /HPF (ref 0–5)
pH: 5 (ref 5.0–8.0)

## 2024-01-20 LAB — BASIC METABOLIC PANEL
Anion gap: 9 (ref 5–15)
BUN: 33 mg/dL — ABNORMAL HIGH (ref 8–23)
CO2: 22 mmol/L (ref 22–32)
Calcium: 9.2 mg/dL (ref 8.9–10.3)
Chloride: 107 mmol/L (ref 98–111)
Creatinine, Ser: 1.86 mg/dL — ABNORMAL HIGH (ref 0.61–1.24)
GFR, Estimated: 37 mL/min — ABNORMAL LOW (ref >60)
Glucose, Bld: 117 mg/dL — ABNORMAL HIGH (ref 70–99)
Potassium: 4.1 mmol/L (ref 3.5–5.1)
Sodium: 138 mmol/L (ref 135–145)

## 2024-01-20 LAB — CBC WITH DIFFERENTIAL/PLATELET
Abs Immature Granulocytes: 0.01 10*3/uL (ref 0.00–0.07)
Basophils Absolute: 0 10*3/uL (ref 0.0–0.1)
Basophils Relative: 0 %
Eosinophils Absolute: 0.1 10*3/uL (ref 0.0–0.5)
Eosinophils Relative: 1 %
HCT: 41.7 % (ref 39.0–52.0)
Hemoglobin: 13.7 g/dL (ref 13.0–17.0)
Immature Granulocytes: 0 %
Lymphocytes Relative: 19 %
Lymphs Abs: 0.9 10*3/uL (ref 0.7–4.0)
MCH: 29.9 pg (ref 26.0–34.0)
MCHC: 32.9 g/dL (ref 30.0–36.0)
MCV: 91 fL (ref 80.0–100.0)
Monocytes Absolute: 0.7 10*3/uL (ref 0.1–1.0)
Monocytes Relative: 14 %
Neutro Abs: 3.2 10*3/uL (ref 1.7–7.7)
Neutrophils Relative %: 66 %
Platelets: 175 10*3/uL (ref 150–400)
RBC: 4.58 MIL/uL (ref 4.22–5.81)
RDW: 15.7 % — ABNORMAL HIGH (ref 11.5–15.5)
WBC: 4.8 10*3/uL (ref 4.0–10.5)
nRBC: 0 % (ref 0.0–0.2)

## 2024-01-20 MED ORDER — TAMSULOSIN HCL 0.4 MG PO CAPS: 0.4000 mg | ORAL_CAPSULE | Freq: Every day | ORAL | 0 refills | Status: DC

## 2024-01-20 NOTE — ED Provider Notes (Signed)
 [facility] Provider Note    Event Date/Time   First MD Initiated Contact with Patient 01/20/24 [phone]     (approximate)   History   Urinary Retention   HPI Javier Watson is a 78 y.o. male presenting today for urinary retention.  Patient states he had a colonoscopy yesterday and since then has not been able to urinate.  He has tried on multiple attempts without success.  Does have a history of enlarged prostate.  Denies any pain with urination or pain in his lower abdominal region.  Has still been able to drink and eat.  Otherwise denies nausea, vomiting, abdominal pain.  No prior similar episodes.     Physical Exam   Triage Vital Signs: ED Triage Vitals  Encounter Vitals Group     BP 01/20/24 0732 (!) 156/72     Systolic BP Percentile --      Diastolic BP Percentile --      Pulse Rate 01/20/24 0732 84     Resp 01/20/24 0732 18     Temp 01/20/24 0732 98.1 F (36.7 C)     Temp Source 01/20/24 0732 Oral     SpO2 01/20/24 0732 97 %     Weight 01/20/24 0729 230 lb (104.3 kg)     Height 01/20/24 0729 5\' 8"  (1.727 m)     Head Circumference --      Peak Flow --      Pain Score 01/20/24 0729 0     Pain Loc --      Pain Education --      Exclude from Growth Chart --     Most recent vital signs: Vitals:   01/20/24 0732  BP: (!) 156/72  Pulse: 84  Resp: 18  Temp: 98.1 F (36.7 C)  SpO2: 97%   I have reviewed the vital signs. General:  Awake, alert, no acute distress. Head:  Normocephalic, Atraumatic. EENT:  PERRL, EOMI, Oral mucosa pink and moist, Neck is supple. Cardiovascular: Regular rate, 2+ distal pulses. Respiratory:  Normal respiratory effort, symmetrical expansion, no distress.   Abdomen: Soft, nondistended nontender Extremities:  Moving all four extremities through full ROM without pain.   Neuro:  Alert and oriented.  Interacting appropriately.   Skin:  Warm, dry, no rash.   Psych: Appropriate affect.    ED Results / Procedures /  Treatments   Labs (all labs ordered are listed, but only abnormal results are displayed) Labs Reviewed  CBC WITH DIFFERENTIAL/PLATELET - Abnormal; Notable for the following components:      Result Value   RDW 15.7 (*)    All other components within normal limits  BASIC METABOLIC PANEL - Abnormal; Notable for the following components:   Glucose, Bld 117 (*)    BUN 33 (*)    Creatinine, Ser 1.86 (*)    GFR, Estimated 37 (*)    All other components within normal limits  URINALYSIS, ROUTINE W REFLEX MICROSCOPIC - Abnormal; Notable for the following components:   Color, Urine YELLOW (*)    APPearance CLEAR (*)    Ketones, ur 5 (*)    Protein, ur 30 (*)    Bacteria, UA RARE (*)    All other components within normal limits     EKG    RADIOLOGY    PROCEDURES:  Critical Care performed: No  Procedures   MEDICATIONS ORDERED IN ED: Medications - No data to display   IMPRESSION / MDM / ASSESSMENT AND PLAN /  ED COURSE  I reviewed the triage vital signs and the nursing notes.                              Differential diagnosis includes, but is not limited to, postprocedural urinary retention, UTI, enlarged prostate  Patient's presentation is most consistent with acute complicated illness / injury requiring diagnostic workup.  Patient is a 78 year old male presenting today for acute urinary retention following colonoscopy yesterday.  Exam unremarkable with no lower abdominal tenderness.  Vital signs stable.  Bladder scan at the bedside shows 900 cc present.  Foley catheter drained 1100 cc here today.  Creatinine and stable with his baseline.  Laboratory workup and UA otherwise negative.  Will discharge with Foley catheter in place and have him follow-up with his urology team within the week.  Patient agreeable with plan and given strict return precautions.  Clinical Course as of 01/20/24 0853  Wed Jan 20, 2024  0848 Creatinine(!): 1.86 Chart review with most recent  creatinine at 1.9 3 months ago so this is comparable to his baseline [DW]    Clinical Course User Index [DW] Janith Lima, MD     FINAL CLINICAL IMPRESSION(S) / ED DIAGNOSES   Final diagnoses:  Acute urinary retention     Rx / DC Orders   ED Discharge Orders          Ordered    tamsulosin (FLOMAX) 0.4 MG CAPS capsule  Daily        01/20/24 0852    Ambulatory referral to Urology        01/20/24 0853             Note:  This document was prepared using Dragon voice recognition software and may include unintentional dictation errors.   Janith Lima, MD 01/20/24 [phone]

## 2024-01-20 NOTE — Discharge Instructions (Signed)
 Please follow-up with your urology team within the week for reassessment so they can remove the Foley catheter and do a trial of void.  I have sent a medication to your pharmacy which she will take once daily which may help with this as well.

## 2024-01-20 NOTE — ED Triage Notes (Addendum)
 Pt via POV from home. Pt states he has not urinated since yesterday morning. Reports he does have hx of enlarged prostate. Denies pain but reports a "fullness" in his pelvic area. Pt is A&Ox4 and NAD, ambulatory to triage at this time.

## 2024-01-26 NOTE — Progress Notes (Unsigned)
 01/28/2024 8:59 PM   Javier Watson June 20, 1946 [id number] Referring provider: [facility], [facility] [address] [place],  [place] [address]  Urological history: 1. Prostate cancer -PSA (12/2023) 11.3 -low risk prostate cancer  2. BPH with LU TS -prostate MRI (10/2019) 217 cc   3. Distal right ureteral stricture -Lasix scan (10/2019) right kidney 44% function and prompt excretion of contrast  4. Erectile dysfunction -contributing factors of age, BPH, prostate cancer, CAD, HTN, sleep apnea, obesity, depression, diabetes, anxiety, PTSD and CKD. -sildenafil 100 mg, on-demand-dosing  No chief complaint on file.  HPI: Javier Watson is a 78 y.o. male who presents today for voiding trial.    Previous records reviewed.   He presented to the ED on 01/20/2024 after not having urinated since the morning before since having his colonoscopy.  A Foley was placed with a residual of 1100 cc.  He was discharged with tamsulosin 0.4 mg daily.    PMH: Past Medical History:  Diagnosis Date   Anxiety    Arthritis    Cancer (HCC)    PROSTATE   Chronic kidney disease    STAGE 3   Chronic pain    secondary to trauma   Depression    Diabetes mellitus without complication (HCC)    patient follows diet only. no medications   Dysrhythmia    irregular heart beat   Essential hypertension    History of kidney stones    Keratoderma    Post traumatic stress disorder (PTSD)    Sleep apnea    C-PAP    Surgical History: Past Surgical History:  Procedure Laterality Date   Arm Surgery     BACK SURGERY  1967   Laminectomy on skull of neck   COLONOSCOPY     CRANIOTOMY  1967   CYSTOSCOPY W/ RETROGRADES Right 06/06/2019   Procedure: CYSTOSCOPY WITH RETROGRADE PYELOGRAM;  Surgeon: Vanna Scotland, MD;  Location: [facility];  Service: Urology;  Laterality: Right;   CYSTOSCOPY WITH URETEROSCOPY AND STENT PLACEMENT Right 01/2016   CYSTOSCOPY/URETEROSCOPY/HOLMIUM LASER/STENT PLACEMENT Right  05/09/2019   Procedure: CYSTOSCOPY/URETEROSCOPY/STENT PLACEMENT;  Surgeon: Vanna Scotland, MD;  Location: [facility];  Service: Urology;  Laterality: Right;   CYSTOSCOPY/URETEROSCOPY/HOLMIUM LASER/STENT PLACEMENT Right 06/06/2019   Procedure: CYSTOSCOPY/URETEROSCOPY/HOLMIUM LASER/STENT Exchange;  Surgeon: Vanna Scotland, MD;  Location: [facility];  Service: Urology;  Laterality: Right;   HYDROCELE EXCISION Right 10/21/2016   Procedure: HYDROCELECTOMY ADULT;  Surgeon: Vanna Scotland, MD;  Location: [facility];  Service: Urology;  Laterality: Right;   ORTHOPEDIC SURGERY     STONE EXTRACTION WITH BASKET  06/06/2019   Procedure: STONE EXTRACTION WITH BASKET;  Surgeon: Vanna Scotland, MD;  Location: [facility];  Service: Urology;;   TOTAL HIP ARTHROPLASTY Left 2006   URETEROSCOPY Right 04/2016   stent removal- spesis    Home Medications:  Allergies as of 01/28/2024       Reactions   Acetaminophen Hives   Enalapril    Other reaction(s): Cough   Lisinopril    Other reaction(s): Cough        Medication List        Accurate as of January 26, 2024  8:59 PM. If you have any questions, ask your nurse or doctor.          ammonium lactate 12 % lotion Commonly known as: LAC-HYDRIN   aspirin EC 81 MG tablet Take by mouth.   buPROPion ER 100 MG 12 hr tablet Commonly known as: WELLBUTRIN SR Take 200  mg by mouth 2 (two) times daily.   chlorthalidone 50 MG tablet Commonly known as: HYGROTON Take 50 mg by mouth daily.   citalopram 40 MG tablet Commonly known as: CELEXA Take 40 mg by mouth every morning.   docusate sodium 100 MG capsule Commonly known as: COLACE Take 1 capsule (100 mg total) by mouth daily.   hydrALAZINE 25 MG tablet Commonly known as: APRESOLINE Take 25 mg by mouth 3 (three) times daily.   hydrocortisone 25 MG suppository Commonly known as: ANUSOL-HC Place rectally.   losartan 100 MG tablet Commonly known as: COZAAR Take 1 tablet (100 mg total) by mouth at  bedtime.   methocarbamol 500 MG tablet Commonly known as: ROBAXIN Take 500 mg by mouth at bedtime.   oxyCODONE 5 MG immediate release tablet Commonly known as: Oxy IR/ROXICODONE Take 5 mg by mouth 2 (two) times daily as needed.   potassium chloride SA 20 MEQ tablet Commonly known as: KLOR-CON M Take 0.5 tablets (10 mEq total) by mouth daily.   pravastatin 80 MG tablet Commonly known as: PRAVACHOL Take 80 mg by mouth daily.   prazosin 5 MG capsule Commonly known as: MINIPRESS Take 5 mg by mouth at bedtime.   Proctosol HC 2.5 % rectal cream Generic drug: hydrocortisone Apply topically.   sildenafil 20 MG tablet Commonly known as: Revatio Take 1 tablet (20 mg total) by mouth daily.   tamsulosin 0.4 MG Caps capsule Commonly known as: FLOMAX Take 1 capsule (0.4 mg total) by mouth daily for 14 days.   traZODone 50 MG tablet Commonly known as: DESYREL Take 50 mg by mouth at bedtime.        Allergies:  Allergies  Allergen Reactions   Acetaminophen Hives   Enalapril     Other reaction(s): Cough   Lisinopril     Other reaction(s): Cough    Family History: Family History  Problem Relation Age of Onset   Hypertension Father    Bladder Cancer Neg Hx    Prostate cancer Neg Hx    Kidney cancer Neg Hx     Social History:  reports that he has never smoked. He has never used smokeless tobacco. He reports that he does not drink alcohol and does not use drugs.  ROS: Pertinent ROS in HPI  Physical Exam: There were no vitals taken for this visit.  Constitutional:  Well nourished. Alert and oriented, No acute distress. HEENT: [place] AT, moist mucus membranes.  Trachea midline, no masses. Cardiovascular: No clubbing, cyanosis, or edema. Respiratory: Normal respiratory effort, no increased work of breathing. GI: Abdomen is soft, non tender, non distended, no abdominal masses. Liver and spleen not palpable.  No hernias appreciated.  Stool sample for occult testing is not  indicated.   GU: No CVA tenderness.  No bladder fullness or masses.  Patient with circumcised/uncircumcised phallus. ***Foreskin easily retracted***  Urethral meatus is patent.  No penile discharge. No penile lesions or rashes. Scrotum without lesions, cysts, rashes and/or edema.  Testicles are located scrotally bilaterally. No masses are appreciated in the testicles. Left and right epididymis are normal. Rectal: Patient with  normal sphincter tone. Anus and perineum without scarring or rashes. No rectal masses are appreciated. Prostate is approximately *** grams, *** nodules are appreciated. Seminal vesicles are normal. Skin: No rashes, bruises or suspicious lesions. Lymph: No cervical or inguinal adenopathy. Neurologic: Grossly intact, no focal deficits, moving all 4 extremities. Psychiatric: Normal mood and affect.  Laboratory Data: Lab Results  Component Value Date  WBC 4.8 01/20/2024   HGB 13.7 01/20/2024   HCT 41.7 01/20/2024   MCV 91.0 01/20/2024   PLT 175 01/20/2024    Lab Results  Component Value Date   CREATININE 1.86 (H) 01/20/2024  I have reviewed the labs.   Pertinent Imaging: ***   Assessment & Plan:  ***  1. Urinary retention -Foley removed this am  2. Prostate cancer -on active surveillance -has yearly follow up in 12/2024    No follow-ups on file.  These notes generated with voice recognition software. I apologize for typographical errors.  Cloretta Ned  [facility] [address]  [address] [place], [place] [address] [phone]

## 2024-01-28 ENCOUNTER — Ambulatory Visit (INDEPENDENT_AMBULATORY_CARE_PROVIDER_SITE_OTHER): Admitting: Urology

## 2024-01-28 ENCOUNTER — Encounter: Payer: Self-pay | Admitting: Urology

## 2024-01-28 VITALS — BP 130/70 | HR 74 | Ht 68.0 in | Wt 230.0 lb

## 2024-01-28 DIAGNOSIS — R339 Retention of urine, unspecified: Secondary | ICD-10-CM

## 2024-01-28 DIAGNOSIS — C61 Malignant neoplasm of prostate: Secondary | ICD-10-CM

## 2024-01-28 LAB — BLADDER SCAN AMB NON-IMAGING: Scan Result: 234

## 2024-01-28 MED ORDER — TAMSULOSIN HCL 0.4 MG PO CAPS: 0.4000 mg | ORAL_CAPSULE | Freq: Every day | ORAL | 0 refills | Status: AC

## 2024-01-28 NOTE — Progress Notes (Signed)
Catheter Removal  Patient is present today for a catheter removal.  43m of water was drained from the balloon. A 18FR foley cath was removed from the bladder, no complications were noted. Patient tolerated well.  Performed by: JGaspar ColaCMA  Follow up/ Additional notes: This afternoon

## 2024-01-31 NOTE — Progress Notes (Signed)
 Error

## 2024-02-24 NOTE — Progress Notes (Unsigned)
 02/25/2024 4:17 PM   Javier Watson 07/11/1946 [id number] Referring provider: [facility] [address] [place],  [place] [address]  Urological history: 1. Prostate cancer -PSA (12/2023) 11.3 -low risk prostate cancer  2. BPH with LU TS -prostate MRI (10/2019) 217 cc   3. Distal right ureteral stricture -Lasix scan (10/2019) right kidney 44% function and prompt excretion of contrast  4. Erectile dysfunction -contributing factors of age, BPH, prostate cancer, CAD, HTN, sleep apnea, obesity, depression, diabetes, anxiety, PTSD and CKD. -sildenafil 100 mg, on-demand-dosing  Chief Complaint  Patient presents with   Follow-up   HPI: Javier Watson is a 78 y.o. male who presents today for one month follow up.   Previous records reviewed.   At his visit on 01/28/2024.  He presented to the ED on 01/20/2024 after not having urinated since the morning before since having his colonoscopy.  A Foley was placed with a residual of 1100 cc.  He was discharged with tamsulosin 0.4 mg daily.   Foley was removed this morning and he has voided several times today.  He feels that he is emptying his bladder.  Patient denies any modifying or aggravating factors.  Patient denies any recent UTI's, gross hematuria, dysuria or suprapubic/flank pain.  Patient denies any fevers, chills, nausea or vomiting.    I PSS 5/2  PVR 184 mL  He can only take the Flomax for 2 days and then he had a intolerable headaches that he needed to see his primary care provider for.  It sounds like they had to give him some sort of injection to stop the headaches.  He is satisfied currently with the way he is urinating.  Patient denies any modifying or aggravating factors.  Patient denies any recent UTI's, gross hematuria, dysuria or suprapubic/flank pain.  Patient denies any fevers, chills, nausea or vomiting.     IPSS     Row Name 02/25/24 1500         International Prostate Symptom Score   How often have you  had the sensation of not emptying your bladder? Less than 1 in 5     How often have you had to urinate less than every two hours? Less than half the time     How often have you found you stopped and started again several times when you urinated? Not at All     How often have you found it difficult to postpone urination? Less than half the time     How often have you had a weak urinary stream? Not at All     How often have you had to strain to start urination? Not at All     How many times did you typically get up at night to urinate? None     Total IPSS Score 5       Quality of Life due to urinary symptoms   If you were to spend the rest of your life with your urinary condition just the way it is now how would you feel about that? Mostly Satisfied              Score:  1-7 Mild 8-19 Moderate 20-35 Severe     PMH: Past Medical History:  Diagnosis Date   Anxiety    Arthritis    Cancer (HCC)    PROSTATE   Chronic kidney disease    STAGE 3   Chronic pain    secondary to trauma   Depression  Diabetes mellitus without complication (HCC)    patient follows diet only. no medications   Dysrhythmia    irregular heart beat   Essential hypertension    History of kidney stones    Keratoderma    Post traumatic stress disorder (PTSD)    Sleep apnea    C-PAP    Surgical History: Past Surgical History:  Procedure Laterality Date   Arm Surgery     BACK SURGERY  1967   Laminectomy on skull of neck   COLONOSCOPY     CRANIOTOMY  1967   CYSTOSCOPY W/ RETROGRADES Right 06/06/2019   Procedure: CYSTOSCOPY WITH RETROGRADE PYELOGRAM;  Surgeon: Vanna Scotland, MD;  Location: [facility];  Service: Urology;  Laterality: Right;   CYSTOSCOPY WITH URETEROSCOPY AND STENT PLACEMENT Right 01/2016   CYSTOSCOPY/URETEROSCOPY/HOLMIUM LASER/STENT PLACEMENT Right 05/09/2019   Procedure: CYSTOSCOPY/URETEROSCOPY/STENT PLACEMENT;  Surgeon: Vanna Scotland, MD;  Location: [facility];  Service: Urology;   Laterality: Right;   CYSTOSCOPY/URETEROSCOPY/HOLMIUM LASER/STENT PLACEMENT Right 06/06/2019   Procedure: CYSTOSCOPY/URETEROSCOPY/HOLMIUM LASER/STENT Exchange;  Surgeon: Vanna Scotland, MD;  Location: [facility];  Service: Urology;  Laterality: Right;   HYDROCELE EXCISION Right 10/21/2016   Procedure: HYDROCELECTOMY ADULT;  Surgeon: Vanna Scotland, MD;  Location: [facility];  Service: Urology;  Laterality: Right;   ORTHOPEDIC SURGERY     STONE EXTRACTION WITH BASKET  06/06/2019   Procedure: STONE EXTRACTION WITH BASKET;  Surgeon: Vanna Scotland, MD;  Location: [facility];  Service: Urology;;   TOTAL HIP ARTHROPLASTY Left 2006   URETEROSCOPY Right 04/2016   stent removal- spesis    Home Medications:  Allergies as of 02/25/2024       Reactions   Acetaminophen Hives   Enalapril    Other reaction(s): Cough   Flomax [tamsulosin] Other (See Comments)   Headaches   Lisinopril    Other reaction(s): Cough        Medication List        Accurate as of February 25, 2024  4:17 PM. If you have any questions, ask your nurse or doctor.          ammonium lactate 12 % lotion Commonly known as: LAC-HYDRIN   aspirin EC 81 MG tablet Take by mouth.   buPROPion ER 100 MG 12 hr tablet Commonly known as: WELLBUTRIN SR Take 200 mg by mouth 2 (two) times daily.   chlorthalidone 50 MG tablet Commonly known as: HYGROTON Take 50 mg by mouth daily.   citalopram 40 MG tablet Commonly known as: CELEXA Take 40 mg by mouth every morning.   docusate sodium 100 MG capsule Commonly known as: COLACE Take 1 capsule (100 mg total) by mouth daily.   hydrALAZINE 25 MG tablet Commonly known as: APRESOLINE Take 25 mg by mouth 3 (three) times daily.   hydrocortisone 25 MG suppository Commonly known as: ANUSOL-HC Place rectally.   losartan 100 MG tablet Commonly known as: COZAAR Take 1 tablet (100 mg total) by mouth at bedtime.   methocarbamol 500 MG tablet Commonly known as: ROBAXIN Take 500 mg by  mouth at bedtime.   oxyCODONE 5 MG immediate release tablet Commonly known as: Oxy IR/ROXICODONE Take 5 mg by mouth 2 (two) times daily as needed.   potassium chloride SA 20 MEQ tablet Commonly known as: KLOR-CON M Take 0.5 tablets (10 mEq total) by mouth daily.   pravastatin 80 MG tablet Commonly known as: PRAVACHOL Take 80 mg by mouth daily.   prazosin 5 MG capsule Commonly known as: MINIPRESS Take 5 mg by  mouth at bedtime.   Proctosol HC 2.5 % rectal cream Generic drug: hydrocortisone Apply topically.   sildenafil 20 MG tablet Commonly known as: Revatio Take 1 tablet (20 mg total) by mouth daily.   tamsulosin 0.4 MG Caps capsule Commonly known as: FLOMAX Take 1 capsule (0.4 mg total) by mouth daily.   traZODone 50 MG tablet Commonly known as: DESYREL Take 50 mg by mouth at bedtime.        Allergies:  Allergies  Allergen Reactions   Acetaminophen Hives   Enalapril     Other reaction(s): Cough   Flomax [Tamsulosin] Other (See Comments)    Headaches   Lisinopril     Other reaction(s): Cough    Family History: Family History  Problem Relation Age of Onset   Hypertension Father    Bladder Cancer Neg Hx    Prostate cancer Neg Hx    Kidney cancer Neg Hx     Social History:  reports that he has never smoked. He has never used smokeless tobacco. He reports that he does not drink alcohol and does not use drugs.  ROS: Pertinent ROS in HPI  Physical Exam: Constitutional:  Well nourished. Alert and oriented, No acute distress. HEENT: [place] AT, moist mucus membranes.  Trachea midline Cardiovascular: No clubbing, cyanosis, or edema. Respiratory: Normal respiratory effort, no increased work of breathing. Neurologic: Grossly intact, no focal deficits, moving all 4 extremities. Psychiatric: Normal mood and affect.   Laboratory Data: N/A  Pertinent Imaging:  02/25/24 15:31  Scan Result 184 ml    Assessment & Plan:    1. Urinary retention -He is voiding  well - He could not tolerate the tamsulosin -He is mostly satisfied with his urinary symptoms as they are -Will continue to monitor and manage conservatively  2. Prostate cancer -on active surveillance -has yearly follow up in 12/2024  Return for keep follow up wtih Dr. Apolinar Junes .  These notes generated with voice recognition software. I apologize for typographical errors.  Cloretta Ned  [facility] [address]  [address] [place], [place] [address] [phone]

## 2024-02-25 ENCOUNTER — Ambulatory Visit: Admitting: Urology

## 2024-02-25 ENCOUNTER — Encounter: Payer: Self-pay | Admitting: Urology

## 2024-02-25 DIAGNOSIS — C61 Malignant neoplasm of prostate: Secondary | ICD-10-CM

## 2024-02-25 DIAGNOSIS — R339 Retention of urine, unspecified: Secondary | ICD-10-CM | POA: Diagnosis not present

## 2024-02-25 LAB — BLADDER SCAN AMB NON-IMAGING: Scan Result: 184

## 2024-05-23 ENCOUNTER — Emergency Department: Admission: EM | Admit: 2024-05-23 | Discharge: 2024-05-23 | Disposition: A | Discharge status: Home / Self Care

## 2024-05-23 ENCOUNTER — Other Ambulatory Visit: Payer: Self-pay

## 2024-05-23 DIAGNOSIS — N189 Chronic kidney disease, unspecified: Secondary | ICD-10-CM | POA: Insufficient documentation

## 2024-05-23 DIAGNOSIS — I129 Hypertensive chronic kidney disease with stage 1 through stage 4 chronic kidney disease, or unspecified chronic kidney disease: Secondary | ICD-10-CM | POA: Insufficient documentation

## 2024-05-23 DIAGNOSIS — R040 Epistaxis: Secondary | ICD-10-CM | POA: Insufficient documentation

## 2024-05-23 MED ORDER — OXYMETAZOLINE HCL 0.05 % NA SOLN
1.0000 | Freq: Once | NASAL | Status: AC
  Administered 2024-05-23: 1 via NASAL
  Filled 2024-05-23: qty 30

## 2024-05-23 MED ORDER — OXYMETAZOLINE HCL 0.05 % NA SOLN: 2.0000 | Freq: Two times a day (BID) | NASAL | 0 refills | Status: AC | PRN

## 2024-05-23 NOTE — Discharge Instructions (Addendum)
 Your evaluation in the emergency department was overall reassuring.  I prescribed you a nasal spray that you can use as needed if you develop any recurrent bleeding.  As discussed, make sure to apply heavy pressure intranasal bridge if bleeding recurs and hold this for 15 minutes at a time until bleeding resolves.  Please do follow-up with your primary care provider for reevaluation.  Return to the emergency department with any new or worsening symptoms.

## 2024-05-23 NOTE — ED Provider Notes (Signed)
 [facility] Provider Note    Event Date/Time   First MD Initiated Contact with Patient 05/23/24 [phone]     (approximate)   History   Epistaxis  Pt reports that his right nare began bleeding at approx 2330. Denies congestion. Bleeding appears to be controlled at this time.    HPI Javier Watson is a 78 y.o. male PMH hypertension, CKD presents for evaluation of epistaxis - Patient reports he developed a nosebleed earlier this evening, apparently after putting his fingernail in his nose to itch it.  Noted it started bleeding, held his nose for several minutes but felt it was ongoing so had a family member bring him to the hospital. -Does have a remote history of a severe bloody nose that he suffered after using a nose hair clipper. -Not on anticoagulation -No rashes, no bleeding elsewhere -No shortness of breath, lightheadedness, chest pain     Physical Exam   Triage Vital Signs: ED Triage Vitals [05/23/24 0104]  Encounter Vitals Group     BP (!) 109/59     Girls Systolic BP Percentile      Girls Diastolic BP Percentile      Boys Systolic BP Percentile      Boys Diastolic BP Percentile      Pulse Rate 65     Resp 16     Temp 98.1 F (36.7 C)     Temp Source Oral     SpO2 99 %     Weight      Height      Head Circumference      Peak Flow      Pain Score 0     Pain Loc      Pain Education      Exclude from Growth Chart     Most recent vital signs: Vitals:   05/23/24 0104  BP: (!) 109/59  Pulse: 65  Resp: 16  Temp: 98.1 F (36.7 C)  SpO2: 99%     General: Awake, no distress.  HEENT: No blood in bilateral nares, no clots visualized.  Hemostatic. CV:  Good peripheral perfusion. Resp:  Normal effort. Other:  No petechial/purpuric rashes   ED Results / Procedures / Treatments   Labs (all labs ordered are listed, but only abnormal results are displayed) Labs Reviewed - No data to  display   EKG  N/a   RADIOLOGY N/a    PROCEDURES:  Critical Care performed: No  Procedures   MEDICATIONS ORDERED IN ED: Medications  oxymetazoline  (AFRIN) 0.05 % nasal spray 1 spray (has no administration in time range)     IMPRESSION / MDM / ASSESSMENT AND PLAN / ED COURSE  I reviewed the triage vital signs and the nursing notes.                              DDX/MDM/AP: Differential diagnosis includes, but is not limited to, uncomplicated epistaxis, doubt severe blood loss anemia, doubt underlying bleeding disorder.  Plan: - Reassurance - Will give a dose of Afrin - Counseled on routine epistaxis management - Did offer screening CBC though patient declined - Stable for PMD follow-up.  ED return precautions in place.  Patient agrees with plan.  Rx Afrin for use as needed.  Patient's presentation is most consistent with acute, uncomplicated illness.         FINAL CLINICAL IMPRESSION(S) / ED DIAGNOSES   Final diagnoses:  Right-sided  epistaxis     Rx / DC Orders   ED Discharge Orders          Ordered    oxymetazoline  (AFRIN) 0.05 % nasal spray  2 times daily PRN        05/23/24 0515             Note:  This document was prepared using Dragon voice recognition software and may include unintentional dictation errors.   Clarine Ozell LABOR, MD 05/23/24 [phone]

## 2024-05-23 NOTE — ED Triage Notes (Signed)
 Pt reports that his right nare began bleeding at approx 2330. Denies congestion. Bleeding appears to be controlled at this time.

## 2024-07-04 ENCOUNTER — Encounter: Payer: Self-pay | Admitting: Urology

## 2024-12-29 ENCOUNTER — Other Ambulatory Visit: Payer: Non-veteran care

## 2025-01-03 ENCOUNTER — Ambulatory Visit: Payer: Non-veteran care | Admitting: Urology

## 2025-01-05 ENCOUNTER — Ambulatory Visit: Admitting: Urology
